# Patient Record
Sex: Female | Born: 1950 | Race: White | Hispanic: No | Marital: Married | Smoking: Never smoker
Health system: Southern US, Community
[De-identification: ages and names within clinical notes are randomized; demographics above are authoritative.]

## PROBLEM LIST (undated history)

## (undated) DIAGNOSIS — R011 Cardiac murmur, unspecified: Secondary | ICD-10-CM

## (undated) DIAGNOSIS — N189 Chronic kidney disease, unspecified: Secondary | ICD-10-CM

## (undated) DIAGNOSIS — R112 Nausea with vomiting, unspecified: Secondary | ICD-10-CM

## (undated) DIAGNOSIS — J302 Other seasonal allergic rhinitis: Secondary | ICD-10-CM

## (undated) DIAGNOSIS — H40009 Preglaucoma, unspecified, unspecified eye: Secondary | ICD-10-CM

## (undated) DIAGNOSIS — T7840XA Allergy, unspecified, initial encounter: Secondary | ICD-10-CM

## (undated) HISTORY — DX: Allergy, unspecified, initial encounter: T78.40XA

## (undated) HISTORY — DX: Cardiac murmur, unspecified: R01.1

## (undated) HISTORY — PX: OTHER SURGICAL HISTORY: SHX169

## (undated) HISTORY — DX: Other seasonal allergic rhinitis: J30.2

## (undated) HISTORY — DX: Preglaucoma, unspecified, unspecified eye: H40.009

---

## 2003-08-09 ENCOUNTER — Other Ambulatory Visit: Admission: RE | Admit: 2003-08-09 | Discharge: 2003-08-09 | Payer: Self-pay | Admitting: Family Medicine

## 2005-06-12 ENCOUNTER — Other Ambulatory Visit: Admission: RE | Admit: 2005-06-12 | Discharge: 2005-06-12 | Payer: Self-pay | Admitting: Family Medicine

## 2006-11-04 ENCOUNTER — Other Ambulatory Visit: Admission: RE | Admit: 2006-11-04 | Discharge: 2006-11-04 | Payer: Self-pay | Admitting: Family Medicine

## 2013-07-10 ENCOUNTER — Encounter: Payer: Self-pay | Admitting: *Deleted

## 2013-08-02 ENCOUNTER — Encounter: Payer: Self-pay | Admitting: Family Medicine

## 2013-08-04 ENCOUNTER — Ambulatory Visit (INDEPENDENT_AMBULATORY_CARE_PROVIDER_SITE_OTHER): Payer: BC Managed Care – PPO | Admitting: Family Medicine

## 2013-08-04 ENCOUNTER — Encounter: Payer: Self-pay | Admitting: Family Medicine

## 2013-08-04 VITALS — BP 111/67 | HR 82 | Temp 97.8°F | Ht 62.0 in | Wt 122.2 lb

## 2013-08-04 DIAGNOSIS — H40009 Preglaucoma, unspecified, unspecified eye: Secondary | ICD-10-CM

## 2013-08-04 DIAGNOSIS — Z23 Encounter for immunization: Secondary | ICD-10-CM

## 2013-08-04 DIAGNOSIS — Z0000 Encounter for general adult medical examination without abnormal findings: Secondary | ICD-10-CM | POA: Insufficient documentation

## 2013-08-04 DIAGNOSIS — R011 Cardiac murmur, unspecified: Secondary | ICD-10-CM | POA: Insufficient documentation

## 2013-08-04 DIAGNOSIS — J302 Other seasonal allergic rhinitis: Secondary | ICD-10-CM | POA: Insufficient documentation

## 2013-08-04 DIAGNOSIS — Z13220 Encounter for screening for lipoid disorders: Secondary | ICD-10-CM

## 2013-08-04 NOTE — Patient Instructions (Addendum)
HEALTH MAINTENANCE Immunizations: Tetanus-Diphtheria Booster [phone] Pertusis Booster [phone] Flu Shot Due: every Fall Pneumonia Vaccine: usually at 60 years of age unless there are certain risk situations. Herpes Zoster/Shingles Vaccine due: usually at 60 years of age HPV XBJ:YNWG age 52 to 23 years in males and females.  Healthy Life Habits: Exercise Goal: 5-6 days/week; start gradually(ie 30 minutes/3days per week) Nutrition: Balanced healthy meals including Vegetables and Fruits. Consider  Reading the following books: 1) Eat to Live by Dr Ottis Stain; 2) Prevent and Reverse Heart Disease by Dr Suzzette Righter.  Vitamins:a multivitamin is okay Aspirin:n/a Stop Tobacco Use:n/a Seat Belt Use:+++ recommended Sunscreen Use:+++ recommended Osteoporosis Prevention: 1) Exercise 2) Calcium/Vitamin D requirements:he [employer] [facility] [employer] recommends:    Calcium:  800 mg/day for children 82-14 years of age          60 mg/day for children 47-63 years of age          60 mg/day for adults 87-60 years of age          60 mg/day for everyone more than 60 years of age     Vitamin D: 800 IU per day or as prescribed if you are deficient.  Recommended Screening Tests: Colon Cancer Screening:2018 Blood work: today Cholesterol Screening:    today        HIV:    n/a                Hepatitis C(people born 1945-1965):n/a since blood  donor Mammogram:done DEXA/Bone Density:deferred Monthly Self Breast Exam:++++  Eye Exam: every 1 to 2 years recommended Dental Health: at least every 6 months  Others:    Living Will/Healthcare Power of Attorney: should have this in order with your personal estate planning

## 2013-08-04 NOTE — Progress Notes (Signed)
Patient ID: Joanne Sullivan, female   DOB: November 21, 1950, 62 y.o.   MRN: [id number] SUBJECTIVE: CC: Chief Complaint  Patient presents with  . Annual Exam    pap     HPI: Patient here for her annual physical. Thought she was due for a pap smear but it was normal 2 years ago and not due until next year.  Otherwise she has been well.no complaints. Her records in [facility] has been updated.  Regularly donates Platelets.  Past Medical History  Diagnosis Date  . Allergy   . Seasonal allergic reaction   . Heart murmur   . Glaucoma suspect    Past Surgical History  Procedure Laterality Date  . Left radial head fracture Left    History   Social History  . Marital Status: Unknown    Spouse Name: N/A    Number of Children: N/A  . Years of Education: N/A   Occupational History  . Not on file.   Social History Main Topics  . Smoking status: Never Smoker   . Smokeless tobacco: Not on file  . Alcohol Use: No  . Drug Use: No  . Sexual Activity: Not on file   Other Topics Concern  . Not on file   Social History Narrative  . No narrative on file   Family History  Problem Relation Age of Onset  . Heart disease Mother     2 MIs  . Hypertension Mother   . Heart disease Father 19    [facility]  . Cancer Sister     ovarian   No current outpatient prescriptions on file prior to visit.   No current facility-administered medications on file prior to visit.   No Known Allergies Immunization History  Administered Date(s) Administered  . Influenza,inj,Quad PF,36+ Mos 08/04/2013   Prior to Admission medications   Not on File    ROS: As above in the HPI. All other systems are stable or negative.  OBJECTIVE: APPEARANCE:  Patient in no acute distress.The patient appeared well nourished and normally developed. Acyanotic. Waist: VITAL SIGNS:BP 111/67  Pulse 82  Temp(Src) 97.8 F (36.6 C) (Oral)  Ht 5\' 2"  (1.575 m)  Wt 122 lb 3.2 oz (55.43 kg)  BMI 22.35  kg/m2 WF   SKIN: warm and  Dry without overt rashes, tattoos and scars  HEAD and Neck: without JVD, Head and scalp: normal Eyes:No scleral icterus. Fundi normal, eye movements normal. Ears: Auricle normal, canal normal, Tympanic membranes normal, insufflation normal. Nose: normal Throat: normal Neck & thyroid: normal  CHEST & LUNGS: Chest wall: normal Lungs: Clear  CVS: Reveals the PMI to be normally located. Regular rhythm, First and Second Heart sounds are normal,  absence of murmurs, rubs or gallops. Peripheral vasculature: Radial pulses: normal Dorsal pedis pulses: normal Posterior pulses: normal  ABDOMEN:  Appearance: normal Benign, no organomegaly, no masses, no Abdominal Aortic enlargement. No Guarding , no rebound. No Bruits. Bowel sounds: normal  RECTAL: Normal, heme negative brown stools.  GU:  Breast Exam:  Appearance: Skin : normal Areolas normalNipples normalDimples: Absent  Palpation:Normal No Masses No Lumps  Lymph Nodes:Axillary: Normal  PELVIC EXAM:  EXTERNAL Vulva:: Normal  SPECULUM: Vagina:atrophic otherwise Normal Cervix: Normal  BIMANUAL: CMT: Negative Adnexae: Normal      No  Masses Uterus: Anteverted  Normal Size No Masses Rectovaginal: Normal Hemoccult: Negative    EXTREMETIES: nonedematous.  MUSCULOSKELETAL:  Spine: normal Joints: intact  NEUROLOGIC: oriented to time,place and person; nonfocal. Strength is normal Sensory  is normal Reflexes are normal Cranial Nerves are normal.  ASSESSMENT: Annual physical exam - Plan: EKG 12-Lead, CMP14+EGFR  Seasonal allergic reaction  Heart murmur  Need for lipid screening - Plan: Lipid panel  Glaucoma suspect, unspecified laterality  Need for prophylactic vaccination and inoculation against influenza   PLAN: Orders Placed This Encounter  Procedures  . CMP14+EGFR  . Lipid panel  . EKG 12-Lead        HEALTH MAINTENANCE Immunizations: Tetanus-Diphtheria  Booster [id number] Pertusis Booster [phone] Flu Shot Due: every Fall Pneumonia Vaccine: usually at 60 years of age unless there are certain risk situations. Herpes Zoster/Shingles Vaccine due: usually at 60 years of age HPV XBJ:YNWG age 65 to 35 years in males and females.  Healthy Life Habits: Exercise Goal: 5-6 days/week; start gradually(ie 30 minutes/3days per week) Nutrition: Balanced healthy meals including Vegetables and Fruits. Consider  Reading the following books: 1) Eat to Live by Dr Ottis Stain; 2) Prevent and Reverse Heart Disease by Dr Suzzette Righter.  Vitamins:a multivitamin is okay Aspirin:n/a Stop Tobacco Use:n/a Seat Belt Use:+++ recommended Sunscreen Use:+++ recommended Osteoporosis Prevention: 1) Exercise 2) Calcium/Vitamin D requirements:he [employer] [facility] [employer] recommends:    Calcium:  800 mg/day for children 57-29 years of age          60 mg/day for children 84-57 years of age          60 mg/day for adults 66-51 years of age          60 mg/day for everyone more than 60 years of age     Vitamin D: 800 IU per day or as prescribed if you are deficient.  Recommended Screening Tests: Colon Cancer Screening:2018 Blood work: today Cholesterol Screening:    today        HIV:    n/a                Hepatitis C(people born 1945-1965):n/a since blood  donor Mammogram:done DEXA/Bone Density:deferred Monthly Self Breast Exam:++++  Eye Exam: every 1 to 2 years recommended Dental Health: at least every 6 months  Others:    Living Will/Healthcare Power of Attorney: should have this in order with your personal estate planning  Depression and Fall risk assessed.  Return in about 1 year (around 08/04/2014) for PEX.  Viona Hosking P. [place] Charon, M.D.

## 2013-08-05 LAB — CMP14+EGFR
ALT: 14 IU/L (ref 0–32)
AST: 21 IU/L (ref 0–40)
Albumin/Globulin Ratio: 1.9 (ref 1.1–2.5)
Albumin: 4.7 g/dL (ref 3.6–4.8)
Alkaline Phosphatase: 94 IU/L (ref 39–117)
BUN/Creatinine Ratio: 15 (ref 11–26)
BUN: 14 mg/dL (ref 8–27)
CO2: 25 mmol/L (ref 18–29)
Calcium: 9.6 mg/dL (ref 8.6–10.2)
Chloride: 100 mmol/L (ref 97–108)
Creatinine, Ser: 0.95 mg/dL (ref 0.57–1.00)
GFR calc Af Amer: 75 mL/min/{1.73_m2} (ref >59)
GFR calc non Af Amer: 65 mL/min/{1.73_m2} (ref >59)
Globulin, Total: 2.5 g/dL (ref 1.5–4.5)
Glucose: 88 mg/dL (ref 65–99)
Potassium: 4.2 mmol/L (ref 3.5–5.2)
Sodium: 143 mmol/L (ref 134–144)
Total Bilirubin: 0.6 mg/dL (ref 0.0–1.2)
Total Protein: 7.2 g/dL (ref 6.0–8.5)

## 2013-08-05 LAB — LIPID PANEL
Chol/HDL Ratio: 1.4 ratio units (ref 0.0–4.4)
Cholesterol, Total: 129 mg/dL (ref 100–199)
HDL: 92 mg/dL (ref >39)
LDL Calculated: 24 mg/dL (ref 0–99)
Triglycerides: 66 mg/dL (ref 0–149)
VLDL Cholesterol Cal: 13 mg/dL (ref 5–40)

## 2013-08-05 NOTE — Progress Notes (Signed)
Quick Note:  Call patient. Labs normal.Excellent lipids. No change in plan. ______

## 2014-09-04 ENCOUNTER — Encounter: Payer: BC Managed Care – PPO | Admitting: Family Medicine

## 2014-09-12 ENCOUNTER — Encounter: Payer: Self-pay | Admitting: Family Medicine

## 2014-09-12 ENCOUNTER — Ambulatory Visit (INDEPENDENT_AMBULATORY_CARE_PROVIDER_SITE_OTHER): Payer: BC Managed Care – PPO | Admitting: Family Medicine

## 2014-09-12 VITALS — BP 124/80 | HR 95 | Temp 97.8°F | Ht 62.5 in | Wt 124.4 lb

## 2014-09-12 DIAGNOSIS — Z0000 Encounter for general adult medical examination without abnormal findings: Secondary | ICD-10-CM

## 2014-09-12 DIAGNOSIS — Z780 Asymptomatic menopausal state: Secondary | ICD-10-CM

## 2014-09-12 DIAGNOSIS — Z01419 Encounter for gynecological examination (general) (routine) without abnormal findings: Secondary | ICD-10-CM

## 2014-09-12 NOTE — Progress Notes (Signed)
   Subjective:    Patient ID: Joanne Sullivan, female    DOB: 1951/06/07, 63 y.o.   MRN: [id number]  HPI 63 year old female here for physical exam generally she is very healthy. There is a history of heart murmur. She is asymptomatic.    Review of Systems  Constitutional: Negative.   HENT: Negative.   Eyes: Negative.   Respiratory: Negative.   Cardiovascular: Negative.   Gastrointestinal: Negative.   Endocrine: Negative.   Genitourinary: Negative.   Musculoskeletal: Positive for myalgias.       Bilateral thumb pain  Skin: Rash: not true rash but healing intertrigo.  Hematological: Negative.   Psychiatric/Behavioral: Negative.        Objective:   Physical Exam  Constitutional: She is oriented to person, place, and time. She appears well-developed and well-nourished.  Eyes: Conjunctivae and EOM are normal.  Neck: Normal range of motion. Neck supple.  Cardiovascular: Normal rate, regular rhythm and normal heart sounds.   Pulmonary/Chest: Effort normal and breath sounds normal.  Abdominal: Soft. Bowel sounds are normal.  Genitourinary: Vagina normal and uterus normal.  Pap taken  Musculoskeletal: Normal range of motion.  Neurological: She is alert and oriented to person, place, and time. She has normal reflexes.  Skin: Skin is warm and dry.  Psychiatric: She has a normal mood and affect. Her behavior is normal. Thought content normal.          Assessment & Plan:  BP 124/80 mmHg  Pulse 95  Temp(Src) 97.8 F (36.6 C) (Oral)  Ht 5' 2.5" (1.588 m)  Wt 124 lb 6.4 oz (56.427 kg)  BMI 22.38 kg/m2   1. Routine general medical examination at a health care facility Normal exam. Will do screening lab work as well as Pap. Would also recommend bone density in the additional calcium and vitamin D supplement in her diet - CMP14+EGFR  Wardell Honour MD - Lipid panel

## 2014-09-12 NOTE — Addendum Note (Signed)
Addended by: Shelbie Ammons on: 09/12/2014 10:05 AM   Modules accepted: Orders

## 2014-09-12 NOTE — Addendum Note (Signed)
Addended by: Earlene Plater on: 09/12/2014 01:29 PM   Modules accepted: Orders, SmartSet

## 2014-09-13 LAB — CMP14+EGFR
ALT: 13 IU/L (ref 0–32)
AST: 22 IU/L (ref 0–40)
Albumin/Globulin Ratio: 1.6 (ref 1.1–2.5)
Albumin: 4.5 g/dL (ref 3.6–4.8)
Alkaline Phosphatase: 99 IU/L (ref 39–117)
BUN/Creatinine Ratio: 13 (ref 11–26)
BUN: 14 mg/dL (ref 8–27)
CO2: 26 mmol/L (ref 18–29)
Calcium: 9.5 mg/dL (ref 8.7–10.3)
Chloride: 101 mmol/L (ref 97–108)
Creatinine, Ser: 1.06 mg/dL — ABNORMAL HIGH (ref 0.57–1.00)
GFR calc Af Amer: 65 mL/min/{1.73_m2} (ref >59)
GFR calc non Af Amer: 57 mL/min/{1.73_m2} — ABNORMAL LOW (ref >59)
Globulin, Total: 2.8 g/dL (ref 1.5–4.5)
Glucose: 84 mg/dL (ref 65–99)
Potassium: 4.3 mmol/L (ref 3.5–5.2)
Sodium: 143 mmol/L (ref 134–144)
Total Bilirubin: 0.8 mg/dL (ref 0.0–1.2)
Total Protein: 7.3 g/dL (ref 6.0–8.5)

## 2014-09-13 LAB — LIPID PANEL
Chol/HDL Ratio: 1.4 ratio units (ref 0.0–4.4)
Cholesterol, Total: 120 mg/dL (ref 100–199)
HDL: 85 mg/dL (ref >39)
LDL Calculated: 19 mg/dL (ref 0–99)
Triglycerides: 78 mg/dL (ref 0–149)
VLDL Cholesterol Cal: 16 mg/dL (ref 5–40)

## 2014-09-13 LAB — PAP IG W/ RFLX HPV ASCU: PAP Smear Comment: 0

## 2014-09-14 ENCOUNTER — Telehealth: Payer: Self-pay

## 2014-09-14 NOTE — Telephone Encounter (Signed)
-----   Message from Wardell Honour, MD sent at 09/14/2014  7:48 AM EST ----- Pap smear was negative. Would repeat in 3 years

## 2014-09-14 NOTE — Telephone Encounter (Signed)
Pt  Aware of pap smear results

## 2014-10-08 ENCOUNTER — Encounter: Payer: Self-pay | Admitting: Family Medicine

## 2014-10-09 NOTE — Telephone Encounter (Signed)
Please advise 

## 2014-11-28 ENCOUNTER — Ambulatory Visit: Payer: BC Managed Care – PPO

## 2014-11-28 ENCOUNTER — Other Ambulatory Visit: Payer: BC Managed Care – PPO

## 2015-05-10 ENCOUNTER — Encounter: Payer: Self-pay | Admitting: Family Medicine

## 2015-06-10 ENCOUNTER — Encounter: Payer: Self-pay | Admitting: *Deleted

## 2015-07-02 ENCOUNTER — Ambulatory Visit (INDEPENDENT_AMBULATORY_CARE_PROVIDER_SITE_OTHER): Payer: BLUE CROSS/BLUE SHIELD | Admitting: Family Medicine

## 2015-07-02 ENCOUNTER — Encounter: Payer: Self-pay | Admitting: Family Medicine

## 2015-07-02 VITALS — BP 127/76 | HR 80 | Temp 97.3°F | Ht 62.5 in | Wt 126.0 lb

## 2015-07-02 DIAGNOSIS — Z0000 Encounter for general adult medical examination without abnormal findings: Secondary | ICD-10-CM

## 2015-07-02 LAB — POCT URINALYSIS DIPSTICK
Bilirubin, UA: NEGATIVE
Blood, UA: NEGATIVE
Glucose, UA: NEGATIVE
Ketones, UA: NEGATIVE
Leukocytes, UA: NEGATIVE
Nitrite, UA: NEGATIVE
Protein, UA: NEGATIVE
Spec Grav, UA: 1.02
Urobilinogen, UA: NEGATIVE
pH, UA: 6

## 2015-07-02 NOTE — Progress Notes (Signed)
   Subjective:    Patient ID: Joanne Sullivan, female    DOB: 1951-09-24, 64 y.o.   MRN: [id number]  HPI  64 year old female who is here for annual physical. She has no complaints or problems. She is still very active doing spin classes and other exercises at her gym and playing softball in the spring and fall. She is on no medications. Last Pap was in 2014 and we decided to do that every 3 years.    Review of Systems  Constitutional: Negative.   HENT: Negative.   Eyes: Negative.   Respiratory: Negative.   Cardiovascular: Negative.   Gastrointestinal: Negative.   Endocrine: Negative.   Genitourinary: Negative.   Hematological: Negative.   Psychiatric/Behavioral: Negative.    Patient Active Problem List   Diagnosis Date Noted  . Annual physical exam 08/04/2013  . Seasonal allergic reaction   . Heart murmur   . Glaucoma suspect    No outpatient encounter prescriptions on file as of 07/02/2015.   No facility-administered encounter medications on file as of 07/02/2015.       Objective:   Physical Exam  Constitutional: She is oriented to person, place, and time. She appears well-developed and well-nourished.  Eyes: Conjunctivae and EOM are normal.  Neck: Normal range of motion. Neck supple.  Cardiovascular: Normal rate, regular rhythm and normal heart sounds.   Pulmonary/Chest: Effort normal and breath sounds normal.  Abdominal: Soft. Bowel sounds are normal.  Musculoskeletal: Normal range of motion.  Neurological: She is alert and oriented to person, place, and time. She has normal reflexes.  Skin: Skin is warm and dry.  Psychiatric: She has a normal mood and affect. Her behavior is normal. Thought content normal.          Assessment & Plan:  1. Health care maintenance Exam is normal will do annual labs. No specific recommendations. - POCT urinalysis dipstick - CMP14+EGFR - Lipid panel - CBC with Differential/Platelet  Wardell Honour MD

## 2015-07-03 LAB — LIPID PANEL
Chol/HDL Ratio: 1.9 ratio units (ref 0.0–4.4)
Cholesterol, Total: 136 mg/dL (ref 100–199)
HDL: 70 mg/dL (ref >39)
LDL Calculated: 22 mg/dL (ref 0–99)
Triglycerides: 220 mg/dL — ABNORMAL HIGH (ref 0–149)
VLDL Cholesterol Cal: 44 mg/dL — ABNORMAL HIGH (ref 5–40)

## 2015-07-03 LAB — CMP14+EGFR
ALT: 12 IU/L (ref 0–32)
AST: 20 IU/L (ref 0–40)
Albumin/Globulin Ratio: 1.5 (ref 1.1–2.5)
Albumin: 4.4 g/dL (ref 3.6–4.8)
Alkaline Phosphatase: 103 IU/L (ref 39–117)
BUN/Creatinine Ratio: 16 (ref 11–26)
BUN: 14 mg/dL (ref 8–27)
Bilirubin Total: 0.6 mg/dL (ref 0.0–1.2)
CO2: 27 mmol/L (ref 18–29)
Calcium: 9.5 mg/dL (ref 8.7–10.3)
Chloride: 101 mmol/L (ref 97–108)
Creatinine, Ser: 0.87 mg/dL (ref 0.57–1.00)
GFR calc Af Amer: 83 mL/min/{1.73_m2} (ref >59)
GFR calc non Af Amer: 72 mL/min/{1.73_m2} (ref >59)
Globulin, Total: 3 g/dL (ref 1.5–4.5)
Glucose: 99 mg/dL (ref 65–99)
Potassium: 4.3 mmol/L (ref 3.5–5.2)
Sodium: 141 mmol/L (ref 134–144)
Total Protein: 7.4 g/dL (ref 6.0–8.5)

## 2015-07-03 LAB — CBC WITH DIFFERENTIAL/PLATELET
Basophils Absolute: 0.1 10*3/uL (ref 0.0–0.2)
Basos: 1 %
EOS (ABSOLUTE): 0.1 10*3/uL (ref 0.0–0.4)
Eos: 3 %
Hematocrit: 43.2 % (ref 34.0–46.6)
Hemoglobin: 14.5 g/dL (ref 11.1–15.9)
Immature Grans (Abs): 0 10*3/uL (ref 0.0–0.1)
Immature Granulocytes: 0 %
Lymphocytes Absolute: 1.5 10*3/uL (ref 0.7–3.1)
Lymphs: 38 %
MCH: 29.4 pg (ref 26.6–33.0)
MCHC: 33.6 g/dL (ref 31.5–35.7)
MCV: 87 fL (ref 79–97)
Monocytes Absolute: 0.5 10*3/uL (ref 0.1–0.9)
Monocytes: 12 %
Neutrophils Absolute: 1.8 10*3/uL (ref 1.4–7.0)
Neutrophils: 46 %
Platelets: 202 10*3/uL (ref 150–379)
RBC: 4.94 x10E6/uL (ref 3.77–5.28)
RDW: 13.6 % (ref 12.3–15.4)
WBC: 3.9 10*3/uL (ref 3.4–10.8)

## 2015-07-05 ENCOUNTER — Encounter: Payer: Self-pay | Admitting: Family Medicine

## 2015-10-02 ENCOUNTER — Telehealth: Payer: Self-pay | Admitting: Family Medicine

## 2016-02-19 DIAGNOSIS — H40003 Preglaucoma, unspecified, bilateral: Secondary | ICD-10-CM | POA: Diagnosis not present

## 2016-02-19 DIAGNOSIS — H5213 Myopia, bilateral: Secondary | ICD-10-CM | POA: Diagnosis not present

## 2016-02-19 DIAGNOSIS — H04123 Dry eye syndrome of bilateral lacrimal glands: Secondary | ICD-10-CM | POA: Diagnosis not present

## 2016-03-18 DIAGNOSIS — H5213 Myopia, bilateral: Secondary | ICD-10-CM | POA: Diagnosis not present

## 2016-05-13 ENCOUNTER — Encounter: Payer: Self-pay | Admitting: Pediatrics

## 2016-05-13 ENCOUNTER — Ambulatory Visit (INDEPENDENT_AMBULATORY_CARE_PROVIDER_SITE_OTHER): Payer: BLUE CROSS/BLUE SHIELD | Admitting: Pediatrics

## 2016-05-13 VITALS — BP 112/74 | HR 75 | Temp 99.1°F | Ht 62.5 in | Wt 127.8 lb

## 2016-05-13 DIAGNOSIS — R198 Other specified symptoms and signs involving the digestive system and abdomen: Secondary | ICD-10-CM

## 2016-05-13 DIAGNOSIS — F458 Other somatoform disorders: Secondary | ICD-10-CM

## 2016-05-13 DIAGNOSIS — R0989 Other specified symptoms and signs involving the circulatory and respiratory systems: Secondary | ICD-10-CM

## 2016-05-13 DIAGNOSIS — J069 Acute upper respiratory infection, unspecified: Secondary | ICD-10-CM | POA: Diagnosis not present

## 2016-05-13 NOTE — Progress Notes (Signed)
    Subjective:    Patient ID: Joanne Sullivan, female    DOB: 1951/09/04, 65 y.o.   MRN: [id number]  CC: Sore Throat and Hoarse   HPI: Joanne Sullivan is a 65 y.o. female presenting for Sore Throat and Hoarse  Had a cold last week Some hoarseness that started a few days ago, now voice starting to come back Yesterday started feeling like she had a lump in her throat, halfway down neck Today throat a little bit sore hasnt had anything to eat today, not sure if food gets caught on the lump or not Still with some congestion  Appetite fine, no weight changes No fevers   Depression screen [facility] 2/9 05/13/2016 07/02/2015 09/12/2014 08/04/2013  Decreased Interest 0 0 0 0  Down, Depressed, Hopeless 0 0 0 0  PHQ - 2 Score 0 0 0 0     Relevant past medical, surgical, family and social history reviewed and updated as indicated.  Interim medical history since our last visit reviewed. Allergies and medications reviewed and updated.  ROS: Per HPI unless specifically indicated above  History  Smoking status  . Never Smoker   Smokeless tobacco  . Never Used       Objective:    BP 112/74 mmHg  Pulse 75  Temp(Src) 99.1 F (37.3 C) (Oral)  Ht 5' 2.5" (1.588 m)  Wt 127 lb 12.8 oz (57.97 kg)  BMI 22.99 kg/m2  Wt Readings from Last 3 Encounters:  05/13/16 127 lb 12.8 oz (57.97 kg)  07/02/15 126 lb (57.153 kg)  09/12/14 124 lb 6.4 oz (56.427 kg)     Gen: NAD, alert, cooperative with exam, NCAT, slightly congested EYES: EOMI, no scleral injection or icterus ENT:  TMs pearly gray b/l, OP with mild erythema LYMPH: no cervical LAD NECK: normal thyroid, no lumps CV: NRRR, normal S1/S2, no murmur Resp: CTABL, no wheezes, normal WOB Ext: No edema, warm Neuro: Alert and oriented MSK: normal muscle bulk     Assessment & Plan:    Joanne Sullivan was seen today for sore throat and hoarseness, started getting congestion and a cold last week. Mostly getting better  from cold other than these symptoms. Discussed using flonase for ongoing congestion, let me know if globus feeling doesn't improve next few days with cold symptoms, will refer to ENT.  Diagnoses and all orders for this visit:  Globus sensation  Acute URI    Follow up plan: As needed  Assunta Found, MD [facility] 05/13/2016, 11:43 AM

## 2016-10-29 DIAGNOSIS — Z1231 Encounter for screening mammogram for malignant neoplasm of breast: Secondary | ICD-10-CM | POA: Diagnosis not present

## 2016-10-30 ENCOUNTER — Ambulatory Visit (INDEPENDENT_AMBULATORY_CARE_PROVIDER_SITE_OTHER): Payer: BLUE CROSS/BLUE SHIELD | Admitting: Family Medicine

## 2016-10-30 ENCOUNTER — Encounter: Payer: Self-pay | Admitting: Family Medicine

## 2016-10-30 VITALS — BP 98/60 | HR 72 | Temp 97.6°F | Ht 62.5 in | Wt 126.0 lb

## 2016-10-30 DIAGNOSIS — Z01411 Encounter for gynecological examination (general) (routine) with abnormal findings: Secondary | ICD-10-CM

## 2016-10-30 DIAGNOSIS — Z23 Encounter for immunization: Secondary | ICD-10-CM

## 2016-10-30 DIAGNOSIS — Z0000 Encounter for general adult medical examination without abnormal findings: Secondary | ICD-10-CM

## 2016-10-30 LAB — CMP14+EGFR
ALT: 13 IU/L (ref 0–32)
AST: 23 IU/L (ref 0–40)
Albumin/Globulin Ratio: 1.6 (ref 1.2–2.2)
Albumin: 4.2 g/dL (ref 3.6–4.8)
Alkaline Phosphatase: 93 IU/L (ref 39–117)
BUN/Creatinine Ratio: 16 (ref 12–28)
BUN: 15 mg/dL (ref 8–27)
Bilirubin Total: 0.5 mg/dL (ref 0.0–1.2)
CO2: 26 mmol/L (ref 18–29)
Calcium: 9.1 mg/dL (ref 8.7–10.3)
Chloride: 102 mmol/L (ref 96–106)
Creatinine, Ser: 0.91 mg/dL (ref 0.57–1.00)
GFR calc Af Amer: 78 mL/min/{1.73_m2} (ref >59)
GFR calc non Af Amer: 67 mL/min/{1.73_m2} (ref >59)
Globulin, Total: 2.6 g/dL (ref 1.5–4.5)
Glucose: 81 mg/dL (ref 65–99)
Potassium: 3.8 mmol/L (ref 3.5–5.2)
Sodium: 143 mmol/L (ref 134–144)
Total Protein: 6.8 g/dL (ref 6.0–8.5)

## 2016-10-30 LAB — LIPID PANEL
Chol/HDL Ratio: 1.5 ratio units (ref 0.0–4.4)
Cholesterol, Total: 122 mg/dL (ref 100–199)
HDL: 84 mg/dL (ref >39)
LDL Calculated: 30 mg/dL (ref 0–99)
Triglycerides: 41 mg/dL (ref 0–149)
VLDL Cholesterol Cal: 8 mg/dL (ref 5–40)

## 2016-10-30 NOTE — Addendum Note (Signed)
Addended by: Zannie Cove on: 10/30/2016 11:05 AM   Modules accepted: Orders

## 2016-10-30 NOTE — Progress Notes (Signed)
   Subjective:    Patient ID: Joanne Sullivan, female    DOB: Jan 03, 1951, 66 y.o.   MRN: [id number]  HPI  Patient is here today for annual wellness exam and follow up of chronic medical problems. She is not currently taking any medications.  66 year old female who is amazingly healthy. She is very conscientious with her diet and exercises at the gym several times a week. She has no complaints or symptoms today. Last Pap was 3-4 years ago and she had a mammogram within the last week or 2.    Patient Active Problem List   Diagnosis Date Noted  . Annual physical exam 08/04/2013  . Seasonal allergic reaction   . Heart murmur   . Glaucoma suspect    No outpatient encounter prescriptions on file as of 10/30/2016.   No facility-administered encounter medications on file as of 10/30/2016.      Review of Systems  Constitutional: Negative.   HENT: Negative.   Eyes: Negative.   Respiratory: Negative.   Cardiovascular: Negative.   Gastrointestinal: Negative.   Endocrine: Negative.   Genitourinary: Negative.   Musculoskeletal: Negative.   Skin: Negative.   Allergic/Immunologic: Negative.   Neurological: Negative.   Hematological: Negative.   Psychiatric/Behavioral: Negative.        Objective:   Physical Exam  Constitutional: She is oriented to person, place, and time. She appears well-developed and well-nourished.  Eyes: Conjunctivae and EOM are normal.  Neck: Normal range of motion. Neck supple.  Cardiovascular: Normal rate, regular rhythm and normal heart sounds.   Pulmonary/Chest: Effort normal and breath sounds normal.  Abdominal: Soft. Bowel sounds are normal.  Genitourinary: Vagina normal and uterus normal. No vaginal discharge found.  Musculoskeletal: Normal range of motion.  Neurological: She is alert and oriented to person, place, and time. She has normal reflexes.  Skin: Skin is warm and dry.  Psychiatric: She has a normal mood and affect. Her behavior is  normal. Thought content normal.   BP 98/60 (BP Location: Left Arm)   Pulse 72   Temp 97.6 F (36.4 C) (Oral)   Ht 5' 2.5" (1.588 m)   Wt 126 lb (57.2 kg)   BMI 22.68 kg/m         Assessment & Plan:  1. Annual physical exam Exam is normal. Do not expect any significant change with lipids or other labs - CMP14+EGFR - Lipid panel  2. Encounter for gynecological examination with abnormal finding Normal pelvic exam. No masses appreciated. - CMP14+EGFR - Lipid panel  Wardell Honour MD

## 2016-10-30 NOTE — Patient Instructions (Signed)
Continue current medications. Continue good therapeutic lifestyle changes which include good diet and exercise. Fall precautions discussed with patient. If an FOBT was given today- please return it to our front desk. If you are over 64 years old - you may need Prevnar 13 or the adult Pneumonia vaccine.  **Flu shots are available--- please call and schedule a FLU-CLINIC appointment**  After your visit with us today you will receive a survey in the mail or online from [employer] regarding your care with us. Please take a moment to fill this out. Your feedback is very important to us as you can help us better understand your patient needs as well as improve your experience and satisfaction. WE CARE ABOUT YOU!!!    

## 2016-11-03 LAB — PAP IG W/ RFLX HPV ASCU: PAP Smear Comment: 0

## 2016-11-20 DIAGNOSIS — H5213 Myopia, bilateral: Secondary | ICD-10-CM | POA: Diagnosis not present

## 2016-12-01 DIAGNOSIS — H40003 Preglaucoma, unspecified, bilateral: Secondary | ICD-10-CM | POA: Diagnosis not present

## 2016-12-08 ENCOUNTER — Encounter: Payer: Self-pay | Admitting: Family Medicine

## 2017-01-26 ENCOUNTER — Encounter: Payer: Self-pay | Admitting: Family Medicine

## 2017-01-26 ENCOUNTER — Ambulatory Visit (INDEPENDENT_AMBULATORY_CARE_PROVIDER_SITE_OTHER): Payer: BLUE CROSS/BLUE SHIELD | Admitting: Family Medicine

## 2017-01-26 VITALS — BP 116/75 | HR 73 | Temp 97.3°F | Ht 62.5 in | Wt 128.4 lb

## 2017-01-26 DIAGNOSIS — R590 Localized enlarged lymph nodes: Secondary | ICD-10-CM | POA: Diagnosis not present

## 2017-01-26 NOTE — Progress Notes (Signed)
   HPI  Patient presents today with tenderness in the underarm.  Patient states that about a day ago she had some tenderness in the right underarm, she palpated around and felt what felt like a lymph node.  She states that it is bothersome but overall not painful and not so serious that she needs or would like medication for it.  She is up-to-date on her mammogram and has had a recent breast exam.  She is not concerned about breast cancer but would like to make sure it's followed up appropriately.    PMH: Smoking status noted ROS: Per HPI  Objective: BP 116/75   Pulse 73   Temp 97.3 F (36.3 C) (Oral)   Ht 5' 2.5" (1.588 m)   Wt 128 lb 6.4 oz (58.2 kg)   BMI 23.11 kg/m  Gen: NAD, alert, cooperative with exam HEENT: NCAT CV: RRR, good S1/S2, no murmur Resp: CTABL, no wheezes, non-labored Ext: No edema, warm Neuro: Alert and oriented, No gross deficits  Skin Right axilla with nontender rubbery, approximately 1 cm mobile lymph node in the right axilla   Assessment and plan:  # Axillary lymphadenopathy Patient does not have any lumps or bumps in the breast that she's concerned about, she's had a recent mammogram and breast exam She would like to watch and wait, recommended 4 weeks of monitoring and return for exam. If still present would recommend breast exam, possible mammogram, however without any concerning areas I would not recommend mammogram. Would recommend referral to surgery for biopsy if persistent   Laroy Apple, MD [facility] 01/26/2017, 4:49 PM

## 2017-01-26 NOTE — Patient Instructions (Signed)
Great to meet you!  Lets follow up in 4 weeks, if it has not resolved we will take the next steps.

## 2017-03-25 DIAGNOSIS — Z1211 Encounter for screening for malignant neoplasm of colon: Secondary | ICD-10-CM | POA: Diagnosis not present

## 2017-03-25 DIAGNOSIS — D122 Benign neoplasm of ascending colon: Secondary | ICD-10-CM | POA: Diagnosis not present

## 2017-03-30 ENCOUNTER — Ambulatory Visit (INDEPENDENT_AMBULATORY_CARE_PROVIDER_SITE_OTHER): Payer: BLUE CROSS/BLUE SHIELD | Admitting: Family Medicine

## 2017-03-30 ENCOUNTER — Encounter: Payer: Self-pay | Admitting: Family Medicine

## 2017-03-30 VITALS — BP 121/71 | HR 71 | Temp 97.6°F | Ht 62.5 in | Wt 129.0 lb

## 2017-03-30 DIAGNOSIS — R599 Enlarged lymph nodes, unspecified: Secondary | ICD-10-CM | POA: Diagnosis not present

## 2017-03-30 NOTE — Progress Notes (Signed)
   HPI  Patient presents today here for enlarged lymph node.  Patient was seen on April 4 for the same problem. At that time she had developed an intermittently tender right axillary lymph node. Since that time it is no longer tender, however is persistent. She has annual mammograms and her last was normal in January. Patient denies any concerning lumps or bumps in the breast.  She denies any history of cancer of any type, breast cancer, or lymph node abnormalities.  No fever, chills, sweats.    PMH: Smoking status noted ROS: Per HPI  Objective: BP 121/71   Pulse 71   Temp 97.6 F (36.4 C) (Oral)   Ht 5' 2.5" (1.588 m)   Wt 129 lb (58.5 kg)   BMI 23.22 kg/m  Gen: NAD, alert, cooperative with exam HEENT: NCAT CV: RRR, good S1/S2, no murmur Resp: CTABL, no wheezes, non-labored Ext: No edema, warm Neuro: Alert and oriented, No gross deficits Skin: R Axillary LN approx 2 cm, mobile, soft, and non tender  Assessment and plan:  # Lymph node enlargement, persistent Considering the proximity of the lymph node to the breast I think it's most prudent to go ahead and refer to Gen. surgery to consider biopsy. Persistent now for 2 months Patient does not have any other concerning findings for breast cancer Lymph node is moderately small, nontender, and mobile.     Orders Placed This Encounter  Procedures  . Ambulatory referral to General Surgery    Referral Priority:   Routine    Referral Type:   Surgical    Referral Reason:   Specialty Services Required    Requested Specialty:   General Surgery    Number of Visits Requested:   [place], MD [facility] 03/30/2017, 8:45 AM

## 2017-03-30 NOTE — Patient Instructions (Signed)
Great to see you!  Come back with any concerns, we will work on scheduling you an appointment witht the surgeon to discuss a possible biopsy

## 2017-04-21 DIAGNOSIS — H04123 Dry eye syndrome of bilateral lacrimal glands: Secondary | ICD-10-CM | POA: Diagnosis not present

## 2017-04-21 DIAGNOSIS — H40003 Preglaucoma, unspecified, bilateral: Secondary | ICD-10-CM | POA: Diagnosis not present

## 2017-04-21 DIAGNOSIS — H5213 Myopia, bilateral: Secondary | ICD-10-CM | POA: Diagnosis not present

## 2017-04-23 ENCOUNTER — Encounter: Payer: Self-pay | Admitting: Family Medicine

## 2017-04-23 ENCOUNTER — Ambulatory Visit (INDEPENDENT_AMBULATORY_CARE_PROVIDER_SITE_OTHER): Payer: BLUE CROSS/BLUE SHIELD | Admitting: Family Medicine

## 2017-04-23 VITALS — BP 114/76 | HR 69 | Temp 98.0°F | Ht 62.5 in | Wt 128.0 lb

## 2017-04-23 DIAGNOSIS — W57XXXA Bitten or stung by nonvenomous insect and other nonvenomous arthropods, initial encounter: Secondary | ICD-10-CM | POA: Diagnosis not present

## 2017-04-23 DIAGNOSIS — M436 Torticollis: Secondary | ICD-10-CM

## 2017-04-23 MED ORDER — DOXYCYCLINE HYCLATE 100 MG PO CAPS: 100.0000 mg | ORAL_CAPSULE | Freq: Two times a day (BID) | ORAL | 0 refills | Status: DC

## 2017-04-23 MED ORDER — CYCLOBENZAPRINE HCL 10 MG PO TABS: 10.0000 mg | ORAL_TABLET | Freq: Three times a day (TID) | ORAL | 1 refills | Status: DC | PRN

## 2017-04-23 NOTE — Progress Notes (Signed)
Chief Complaint  Patient presents with  . Neck Pain    pt here today c/o neck pain x 4 days    HPI  Patient presents today for Her neck is felt stiff. It's hard to turn it. No known injury. Onset 4 days ago. A few days for before that her husband had found a tick on her arm pulled it off. She brought the tick in today showing its size of a typical deer tick nymph. She notes she had 2 bites back in April as well.Denies fever chills sweats, rash, arthralgia(other than the neck pain noted today).  PMH: Smoking status noted ROS: Per HPI  Objective: BP 114/76   Pulse 69   Temp 98 F (36.7 C) (Oral)   Ht 5' 2.5" (1.588 m)   Wt 128 lb (58.1 kg)   BMI 23.04 kg/m  Gen: NAD, alert, cooperative with exam HEENT: NCAT, EOMI, PERRL CV: RRR, good S1/S2, no murmur Resp: CTABL, no wheezes, non-labored Abd: SNTND, BS present, no guarding or organomegaly Ext: No edema, warm Neuro: Alert and oriented, No gross deficits  Assessment and plan:  1. Tick bite, initial encounter   2. Torticollis, acute     Meds ordered this encounter  Medications  . doxycycline (VIBRAMYCIN) 100 MG capsule    Sig: Take 1 capsule (100 mg total) by mouth 2 (two) times daily.    Dispense:  20 capsule    Refill:  0  . cyclobenzaprine (FLEXERIL) 10 MG tablet    Sig: Take 1 tablet (10 mg total) by mouth 3 (three) times daily as needed for muscle spasms.    Dispense:  90 tablet    Refill:  1    Orders Placed This Encounter  Procedures  . Lyme Ab/Western Blot Reflex  . Sedimentation rate  . CBC with Differential/Platelet    Follow up as needed.  Claretta Fraise, MD

## 2017-04-27 LAB — CBC WITH DIFFERENTIAL/PLATELET
Basophils Absolute: 0 10*3/uL (ref 0.0–0.2)
Basos: 1 %
EOS (ABSOLUTE): 0.1 10*3/uL (ref 0.0–0.4)
Eos: 1 %
Hematocrit: 37.8 % (ref 34.0–46.6)
Hemoglobin: 12 g/dL (ref 11.1–15.9)
Immature Grans (Abs): 0 10*3/uL (ref 0.0–0.1)
Immature Granulocytes: 0 %
Lymphocytes Absolute: 1.4 10*3/uL (ref 0.7–3.1)
Lymphs: 23 %
MCH: 27.5 pg (ref 26.6–33.0)
MCHC: 31.7 g/dL (ref 31.5–35.7)
MCV: 87 fL (ref 79–97)
Monocytes Absolute: 0.8 10*3/uL (ref 0.1–0.9)
Monocytes: 14 %
Neutrophils Absolute: 3.6 10*3/uL (ref 1.4–7.0)
Neutrophils: 61 %
Platelets: 222 10*3/uL (ref 150–379)
RBC: 4.37 x10E6/uL (ref 3.77–5.28)
RDW: 15.7 % — ABNORMAL HIGH (ref 12.3–15.4)
WBC: 5.9 10*3/uL (ref 3.4–10.8)

## 2017-04-27 LAB — SEDIMENTATION RATE: Sed Rate: 14 mm/hr (ref 0–40)

## 2017-04-27 LAB — LYME AB/WESTERN BLOT REFLEX
LYME DISEASE AB, QUANT, IGM: <0.8 index (ref 0.00–0.79)
Lyme IgG/IgM Ab: <0.91 {ISR} (ref 0.00–0.90)

## 2017-05-03 ENCOUNTER — Other Ambulatory Visit: Payer: Self-pay | Admitting: Surgery

## 2017-05-03 DIAGNOSIS — R590 Localized enlarged lymph nodes: Secondary | ICD-10-CM

## 2017-05-21 ENCOUNTER — Ambulatory Visit
Admission: RE | Admit: 2017-05-21 | Discharge: 2017-05-21 | Disposition: A | Discharge status: Home / Self Care | Payer: BLUE CROSS/BLUE SHIELD | Source: Ambulatory Visit | Attending: Surgery | Admitting: Surgery

## 2017-05-21 ENCOUNTER — Ambulatory Visit: Admission: RE | Admit: 2017-05-21 | Payer: BLUE CROSS/BLUE SHIELD | Source: Ambulatory Visit

## 2017-05-21 ENCOUNTER — Other Ambulatory Visit: Payer: Self-pay | Admitting: Surgery

## 2017-05-21 DIAGNOSIS — R921 Mammographic calcification found on diagnostic imaging of breast: Secondary | ICD-10-CM | POA: Diagnosis not present

## 2017-05-21 DIAGNOSIS — N6310 Unspecified lump in the right breast, unspecified quadrant: Secondary | ICD-10-CM

## 2017-05-21 DIAGNOSIS — R590 Localized enlarged lymph nodes: Secondary | ICD-10-CM

## 2017-05-21 DIAGNOSIS — R922 Inconclusive mammogram: Secondary | ICD-10-CM | POA: Diagnosis not present

## 2017-05-21 DIAGNOSIS — N6489 Other specified disorders of breast: Secondary | ICD-10-CM | POA: Diagnosis not present

## 2017-05-25 NOTE — Progress Notes (Signed)
Please call the patient and let them know that their mammogram and ultrasound was normal.  There is no sign of enlarged lymph nodes, so there is no need for any further testing or work-up. Follow-up PRN.

## 2017-07-12 DIAGNOSIS — H5213 Myopia, bilateral: Secondary | ICD-10-CM | POA: Diagnosis not present

## 2017-07-20 DIAGNOSIS — Z23 Encounter for immunization: Secondary | ICD-10-CM | POA: Diagnosis not present

## 2017-12-09 ENCOUNTER — Encounter: Payer: BLUE CROSS/BLUE SHIELD | Admitting: Pediatrics

## 2017-12-13 ENCOUNTER — Encounter: Payer: BLUE CROSS/BLUE SHIELD | Admitting: Pediatrics

## 2017-12-29 ENCOUNTER — Ambulatory Visit (INDEPENDENT_AMBULATORY_CARE_PROVIDER_SITE_OTHER): Payer: BLUE CROSS/BLUE SHIELD | Admitting: Pediatrics

## 2017-12-29 ENCOUNTER — Encounter: Payer: Self-pay | Admitting: Pediatrics

## 2017-12-29 ENCOUNTER — Ambulatory Visit (INDEPENDENT_AMBULATORY_CARE_PROVIDER_SITE_OTHER): Payer: BLUE CROSS/BLUE SHIELD

## 2017-12-29 VITALS — BP 124/73 | HR 62 | Temp 97.5°F | Ht 62.5 in | Wt 125.2 lb

## 2017-12-29 DIAGNOSIS — Z780 Asymptomatic menopausal state: Secondary | ICD-10-CM | POA: Diagnosis not present

## 2017-12-29 DIAGNOSIS — Z0000 Encounter for general adult medical examination without abnormal findings: Secondary | ICD-10-CM

## 2017-12-29 DIAGNOSIS — M810 Age-related osteoporosis without current pathological fracture: Secondary | ICD-10-CM | POA: Diagnosis not present

## 2017-12-29 NOTE — Progress Notes (Signed)
  Subjective:   Patient ID: Lillie Portner, female    DOB: [birth year], 67 y.o.   MRN: [id number] CC: Annual Exam  HPI: Miyuki Rzasa is a 67 y.o. female presenting for Annual Exam  Colonoscopy: done 2018, f/u 10 yrs  Mammo: due, has gotten reminder letter from [facility]  Pap smear: 10/2016  Recent lipid panel at work: Tot Chol 122, HDL 84, LDL 14, TRG 106   Stays active, goes to the gym twice a week for an hour, does cardio and weight lifting with classes   Works in [employer]  Relevant past medical, surgical, family and social history reviewed. Allergies and medications reviewed and updated. Social History   Tobacco Use  Smoking Status Never Smoker  Smokeless Tobacco Never Used   ROS: all systems negative other than what is in HPI  Objective:    BP 124/73   Pulse 62   Temp (!) 97.5 F (36.4 C) (Oral)   Ht 5' 2.5" (1.588 m)   Wt 125 lb 3.2 oz (56.8 kg)   BMI 22.53 kg/m   Wt Readings from Last 3 Encounters:  12/29/17 125 lb 3.2 oz (56.8 kg)  04/23/17 128 lb (58.1 kg)  03/30/17 129 lb (58.5 kg)    Gen: NAD, alert, cooperative with exam, NCAT EYES: EOMI, no conjunctival injection, or no icterus ENT:  TMs pearly gray b/l, OP without erythema LYMPH: no cervical LAD CV: NRRR, normal S1/S2, no murmur, distal pulses 2+ b/l Resp: CTABL, no wheezes, normal WOB Abd: +BS, soft, NTND. no guarding or organomegaly Ext: No edema, warm Neuro: Alert and oriented, strength equal b/l UE and LE, coordination grossly normal MSK: normal muscle bulk  Assessment & Plan:  Neida was seen today for annual exam.  Diagnoses and all orders for this visit:  Encounter for preventive health examination  Post-menopausal -     DG [facility] DEXA   Follow up plan: 1 yr, sooner if needed Assunta Found, MD [place]

## 2018-01-24 ENCOUNTER — Other Ambulatory Visit: Payer: Self-pay | Admitting: Pediatrics

## 2018-01-24 DIAGNOSIS — Z1159 Encounter for screening for other viral diseases: Secondary | ICD-10-CM

## 2018-01-24 DIAGNOSIS — M810 Age-related osteoporosis without current pathological fracture: Secondary | ICD-10-CM

## 2018-01-24 MED ORDER — ALENDRONATE SODIUM 70 MG PO TABS: 70.0000 mg | ORAL_TABLET | ORAL | 3 refills | Status: DC

## 2018-02-04 ENCOUNTER — Other Ambulatory Visit: Payer: BLUE CROSS/BLUE SHIELD

## 2018-02-04 DIAGNOSIS — Z1159 Encounter for screening for other viral diseases: Secondary | ICD-10-CM

## 2018-02-04 DIAGNOSIS — M810 Age-related osteoporosis without current pathological fracture: Secondary | ICD-10-CM | POA: Diagnosis not present

## 2018-02-05 LAB — BMP8+EGFR
BUN/Creatinine Ratio: 16 (ref 12–28)
BUN: 15 mg/dL (ref 8–27)
CO2: 28 mmol/L (ref 20–29)
Calcium: 9.3 mg/dL (ref 8.7–10.3)
Chloride: 101 mmol/L (ref 96–106)
Creatinine, Ser: 0.92 mg/dL (ref 0.57–1.00)
GFR calc Af Amer: 76 mL/min/{1.73_m2} (ref >59)
GFR calc non Af Amer: 66 mL/min/{1.73_m2} (ref >59)
Glucose: 86 mg/dL (ref 65–99)
Potassium: 4 mmol/L (ref 3.5–5.2)
Sodium: 141 mmol/L (ref 134–144)

## 2018-02-05 LAB — HEPATITIS C ANTIBODY: Hep C Virus Ab: <0.1 s/co ratio (ref 0.0–0.9)

## 2018-07-12 DIAGNOSIS — Z1231 Encounter for screening mammogram for malignant neoplasm of breast: Secondary | ICD-10-CM | POA: Diagnosis not present

## 2018-07-12 LAB — HM MAMMOGRAPHY

## 2018-07-21 DIAGNOSIS — Z23 Encounter for immunization: Secondary | ICD-10-CM | POA: Diagnosis not present

## 2018-12-26 ENCOUNTER — Other Ambulatory Visit: Payer: Self-pay | Admitting: *Deleted

## 2019-01-10 ENCOUNTER — Other Ambulatory Visit: Payer: Self-pay | Admitting: Family Medicine

## 2019-01-10 ENCOUNTER — Telehealth: Payer: Self-pay | Admitting: Family Medicine

## 2019-01-10 DIAGNOSIS — M810 Age-related osteoporosis without current pathological fracture: Secondary | ICD-10-CM

## 2019-01-10 DIAGNOSIS — Z114 Encounter for screening for human immunodeficiency virus [HIV]: Secondary | ICD-10-CM

## 2019-01-10 DIAGNOSIS — Z130 Encounter for screening for diseases of the blood and blood-forming organs and certain disorders involving the immune mechanism: Secondary | ICD-10-CM

## 2019-01-10 DIAGNOSIS — Z13220 Encounter for screening for lipoid disorders: Secondary | ICD-10-CM

## 2019-01-10 NOTE — Telephone Encounter (Signed)
Left message,  Orders have been done for lab work.  Please be fasting.

## 2019-01-10 NOTE — Telephone Encounter (Signed)
Labs placed. Please come in fasting.

## 2019-01-17 ENCOUNTER — Encounter: Payer: BLUE CROSS/BLUE SHIELD | Admitting: Family Medicine

## 2019-02-14 ENCOUNTER — Encounter: Payer: BLUE CROSS/BLUE SHIELD | Admitting: Family Medicine

## 2019-04-10 ENCOUNTER — Telehealth: Payer: Self-pay | Admitting: Family Medicine

## 2019-04-10 ENCOUNTER — Other Ambulatory Visit: Payer: Self-pay

## 2019-04-11 ENCOUNTER — Other Ambulatory Visit: Payer: Self-pay

## 2019-04-11 ENCOUNTER — Encounter: Payer: Self-pay | Admitting: Family Medicine

## 2019-04-11 ENCOUNTER — Ambulatory Visit (INDEPENDENT_AMBULATORY_CARE_PROVIDER_SITE_OTHER): Payer: BLUE CROSS/BLUE SHIELD | Admitting: Family Medicine

## 2019-04-11 VITALS — BP 119/75 | HR 67 | Temp 98.4°F | Ht 62.0 in | Wt 125.0 lb

## 2019-04-11 DIAGNOSIS — Z13220 Encounter for screening for lipoid disorders: Secondary | ICD-10-CM

## 2019-04-11 DIAGNOSIS — Z23 Encounter for immunization: Secondary | ICD-10-CM | POA: Diagnosis not present

## 2019-04-11 DIAGNOSIS — Z0000 Encounter for general adult medical examination without abnormal findings: Secondary | ICD-10-CM

## 2019-04-11 DIAGNOSIS — M810 Age-related osteoporosis without current pathological fracture: Secondary | ICD-10-CM | POA: Diagnosis not present

## 2019-04-11 DIAGNOSIS — L819 Disorder of pigmentation, unspecified: Secondary | ICD-10-CM

## 2019-04-11 DIAGNOSIS — L821 Other seborrheic keratosis: Secondary | ICD-10-CM

## 2019-04-11 DIAGNOSIS — Z0001 Encounter for general adult medical examination with abnormal findings: Secondary | ICD-10-CM | POA: Diagnosis not present

## 2019-04-11 NOTE — Patient Instructions (Signed)
I will work on getting the Prolia covered.  I think you would tolerate this medication better than the Fosamax.    Health Maintenance, Female Adopting a healthy lifestyle and getting preventive care can go a long way to promote health and wellness. Talk with your health care provider about what schedule of regular examinations is right for you. This is a good chance for you to check in with your provider about disease prevention and staying healthy. In between checkups, there are plenty of things you can do on your own. Experts have done a lot of research about which lifestyle changes and preventive measures are most likely to keep you healthy. Ask your health care provider for more information. Weight and diet Eat a healthy diet  Be sure to include plenty of vegetables, fruits, low-fat dairy products, and lean protein.  Do not eat a lot of foods high in solid fats, added sugars, or salt.  Get regular exercise. This is one of the most important things you can do for your health. ? Most adults should exercise for at least 150 minutes each week. The exercise should increase your heart rate and make you sweat (moderate-intensity exercise). ? Most adults should also do strengthening exercises at least twice a week. This is in addition to the moderate-intensity exercise. Maintain a healthy weight  Body mass index (BMI) is a measurement that can be used to identify possible weight problems. It estimates body fat based on height and weight. Your health care provider can help determine your BMI and help you achieve or maintain a healthy weight.  For females 71 years of age and older: ? A BMI below 18.5 is considered underweight. ? A BMI of 18.5 to 24.9 is normal. ? A BMI of 25 to 29.9 is considered overweight. ? A BMI of 30 and above is considered obese. Watch levels of cholesterol and blood lipids  You should start having your blood tested for lipids and cholesterol at 66 years of age, then have  this test every 5 years.  You may need to have your cholesterol levels checked more often if: ? Your lipid or cholesterol levels are high. ? You are older than 66 years of age. ? You are at high risk for heart disease. Cancer screening Lung Cancer  Lung cancer screening is recommended for adults 99-69 years old who are at high risk for lung cancer because of a history of smoking.  A yearly low-dose CT scan of the lungs is recommended for people who: ? Currently smoke. ? Have quit within the past 15 years. ? Have at least a 30-pack-year history of smoking. A pack year is smoking an average of one pack of cigarettes a day for 1 year.  Yearly screening should continue until it has been 15 years since you quit.  Yearly screening should stop if you develop a health problem that would prevent you from having lung cancer treatment. Breast Cancer  Practice breast self-awareness. This means understanding how your breasts normally appear and feel.  It also means doing regular breast self-exams. Let your health care provider know about any changes, no matter how small.  If you are in your 20s or 30s, you should have a clinical breast exam (CBE) by a health care provider every 1-3 years as part of a regular health exam.  If you are 7 or older, have a CBE every year. Also consider having a breast X-ray (mammogram) every year.  If you have a family history  of breast cancer, talk to your health care provider about genetic screening.  If you are at high risk for breast cancer, talk to your health care provider about having an MRI and a mammogram every year.  Breast cancer gene (BRCA) assessment is recommended for women who have family members with BRCA-related cancers. BRCA-related cancers include: ? Breast. ? Ovarian. ? Tubal. ? Peritoneal cancers.  Results of the assessment will determine the need for genetic counseling and BRCA1 and BRCA2 testing. Cervical Cancer Your health care  provider may recommend that you be screened regularly for cancer of the pelvic organs (ovaries, uterus, and vagina). This screening involves a pelvic examination, including checking for microscopic changes to the surface of your cervix (Pap test). You may be encouraged to have this screening done every 3 years, beginning at age 8.  For women ages 72-65, health care providers may recommend pelvic exams and Pap testing every 3 years, or they may recommend the Pap and pelvic exam, combined with testing for human papilloma virus (HPV), every 5 years. Some types of HPV increase your risk of cervical cancer. Testing for HPV may also be done on women of any age with unclear Pap test results.  Other health care providers may not recommend any screening for nonpregnant women who are considered low risk for pelvic cancer and who do not have symptoms. Ask your health care provider if a screening pelvic exam is right for you.  If you have had past treatment for cervical cancer or a condition that could lead to cancer, you need Pap tests and screening for cancer for at least 20 years after your treatment. If Pap tests have been discontinued, your risk factors (such as having a new sexual partner) need to be reassessed to determine if screening should resume. Some women have medical problems that increase the chance of getting cervical cancer. In these cases, your health care provider may recommend more frequent screening and Pap tests. Colorectal Cancer  This type of cancer can be detected and often prevented.  Routine colorectal cancer screening usually begins at 66 years of age and continues through 66 years of age.  Your health care provider may recommend screening at an earlier age if you have risk factors for colon cancer.  Your health care provider may also recommend using home test kits to check for hidden blood in the stool.  A small camera at the end of a tube can be used to examine your colon directly  (sigmoidoscopy or colonoscopy). This is done to check for the earliest forms of colorectal cancer.  Routine screening usually begins at age 72.  Direct examination of the colon should be repeated every 5-10 years through 67 years of age. However, you may need to be screened more often if early forms of precancerous polyps or small growths are found. Skin Cancer  Check your skin from head to toe regularly.  Tell your health care provider about any new moles or changes in moles, especially if there is a change in a mole's shape or color.  Also tell your health care provider if you have a mole that is larger than the size of a pencil eraser.  Always use sunscreen. Apply sunscreen liberally and repeatedly throughout the day.  Protect yourself by wearing long sleeves, pants, a wide-brimmed hat, and sunglasses whenever you are outside. Heart disease, diabetes, and high blood pressure  High blood pressure causes heart disease and increases the risk of stroke. High blood pressure is more  likely to develop in: ? People who have blood pressure in the high end of the normal range (130-139/85-89 mm Hg). ? People who are overweight or obese. ? People who are African American.  If you are 70-23 years of age, have your blood pressure checked every 3-5 years. If you are 5 years of age or older, have your blood pressure checked every year. You should have your blood pressure measured twice-once when you are at a hospital or clinic, and once when you are not at a hospital or clinic. Record the average of the two measurements. To check your blood pressure when you are not at a hospital or clinic, you can use: ? An automated blood pressure machine at a pharmacy. ? A home blood pressure monitor.  If you are between 14 years and 28 years old, ask your health care provider if you should take aspirin to prevent strokes.  Have regular diabetes screenings. This involves taking a blood sample to check your  fasting blood sugar level. ? If you are at a normal weight and have a low risk for diabetes, have this test once every three years after 66 years of age. ? If you are overweight and have a high risk for diabetes, consider being tested at a younger age or more often. Preventing infection Hepatitis B  If you have a higher risk for hepatitis B, you should be screened for this virus. You are considered at high risk for hepatitis B if: ? You were born in a country where hepatitis B is common. Ask your health care provider which countries are considered high risk. ? Your parents were born in a high-risk country, and you have not been immunized against hepatitis B (hepatitis B vaccine). ? You have HIV or AIDS. ? You use needles to inject street drugs. ? You live with someone who has hepatitis B. ? You have had sex with someone who has hepatitis B. ? You get hemodialysis treatment. ? You take certain medicines for conditions, including cancer, organ transplantation, and autoimmune conditions. Hepatitis C  Blood testing is recommended for: ? Everyone born from [birth year] through 1963. ? Anyone with known risk factors for hepatitis C. Sexually transmitted infections (STIs)  You should be screened for sexually transmitted infections (STIs) including gonorrhea and chlamydia if: ? You are sexually active and are younger than 66 years of age. ? You are older than 66 years of age and your health care provider tells you that you are at risk for this type of infection. ? Your sexual activity has changed since you were last screened and you are at an increased risk for chlamydia or gonorrhea. Ask your health care provider if you are at risk.  If you do not have HIV, but are at risk, it may be recommended that you take a prescription medicine daily to prevent HIV infection. This is called pre-exposure prophylaxis (PrEP). You are considered at risk if: ? You are sexually active and do not regularly use condoms or  know the HIV status of your partner(s). ? You take drugs by injection. ? You are sexually active with a partner who has HIV. Talk with your health care provider about whether you are at high risk of being infected with HIV. If you choose to begin PrEP, you should first be tested for HIV. You should then be tested every 3 months for as long as you are taking PrEP. Pregnancy  If you are premenopausal and you may become pregnant, ask  your health care provider about preconception counseling.  If you may become pregnant, take 400 to 800 micrograms (mcg) of folic acid every day.  If you want to prevent pregnancy, talk to your health care provider about birth control (contraception). Osteoporosis and menopause  Osteoporosis is a disease in which the bones lose minerals and strength with aging. This can result in serious bone fractures. Your risk for osteoporosis can be identified using a bone density scan.  If you are 63 years of age or older, or if you are at risk for osteoporosis and fractures, ask your health care provider if you should be screened.  Ask your health care provider whether you should take a calcium or vitamin D supplement to lower your risk for osteoporosis.  Menopause may have certain physical symptoms and risks.  Hormone replacement therapy may reduce some of these symptoms and risks. Talk to your health care provider about whether hormone replacement therapy is right for you. Follow these instructions at home:  Schedule regular health, dental, and eye exams.  Stay current with your immunizations.  Do not use any tobacco products including cigarettes, chewing tobacco, or electronic cigarettes.  If you are pregnant, do not drink alcohol.  If you are breastfeeding, limit how much and how often you drink alcohol.  Limit alcohol intake to no more than 1 drink per day for nonpregnant women. One drink equals 12 ounces of beer, 5 ounces of wine, or 1 ounces of hard  liquor.  Do not use street drugs.  Do not share needles.  Ask your health care provider for help if you need support or information about quitting drugs.  Tell your health care provider if you often feel depressed.  Tell your health care provider if you have ever been abused or do not feel safe at home. This information is not intended to replace advice given to you by your health care provider. Make sure you discuss any questions you have with your health care provider. Document Released: 04/27/2011 Document Revised: 03/19/2016 Document Reviewed: 07/16/2015 Elsevier Interactive Patient Education  2019 [employer].

## 2019-04-11 NOTE — Progress Notes (Signed)
Joanne Sullivan is a 68 y.o. female presents to office today for annual physical exam examination.    Concerns today include: 1.  Osteoporosis Patient reports she had to discontinue the Fosamax because she was having severe acid reflux from the medication and had some joint pain as well.  She would be amenable to transitioning over to something like Prolia.  She does engage in regular physical activity, walking 3 miles 4 to 5 days/week.  She does some upper extremity weightlifting as well.  She takes calcium and vitamin D supplements OTC.  She tries to maintain a fairly healthy diet but is not really like drinking milk.  Occupation: Joanne Sullivan, Marital status: Married, Substance use: None Diet: Eats a variety of foods but low in dairy, Exercise: Regular exercise Last eye exam: Has appointment next week with Joanne Sullivan Last colonoscopy: 03/25/2017, repeat 10y Last mammogram: 06/2018 Last pap smear: N/A, greater than 53 years old.  Previous Paps were normal. Refills needed today: None. Immunizations needed: PNA vaccine  Past Medical History:  Diagnosis Date  . Allergy   . Glaucoma suspect   . Heart murmur   . Seasonal allergic reaction    Social History   Socioeconomic History  . Marital status: Married    Spouse name: Not on file  . Number of children: Not on file  . Years of education: Not on file  . Highest education level: Not on file  Occupational History  . Not on file  Social Needs  . Financial resource strain: Not on file  . Food insecurity    Worry: Not on file    Inability: Not on file  . Transportation needs    Medical: Not on file    Non-medical: Not on file  Tobacco Use  . Smoking status: Never Smoker  . Smokeless tobacco: Never Used  Substance and Sexual Activity  . Alcohol use: No  . Drug use: No  . Sexual activity: Not on file  Lifestyle  . Physical activity    Days per week: Not on file    Minutes per session: Not on file  .  Stress: Not on file  Relationships  . Social Herbalist on phone: Not on file    Gets together: Not on file    Attends religious service: Not on file    Active member of club or organization: Not on file    Attends meetings of clubs or organizations: Not on file    Relationship status: Not on file  . Intimate partner violence    Fear of current or ex partner: Not on file    Emotionally abused: Not on file    Physically abused: Not on file    Forced sexual activity: Not on file  Other Topics Concern  . Not on file  Social History Narrative  . Not on file   Past Surgical History:  Procedure Laterality Date  . left radial head fracture Left    Family History  Problem Relation Age of Onset  . Heart disease Mother        2 MIs  . Hypertension Mother   . Heart disease Father 52       AMI  . Cancer Sister        ovarian    Current Outpatient Medications:  .  calcium carbonate (OSCAL) 1500 (600 Ca) MG TABS tablet, Take 1,200 mg by mouth daily with breakfast., Disp: , Rfl:   No Known  Allergies   ROS: Review of Systems Constitutional: negative Eyes: positive for glaucoma Ears, nose, mouth, throat, and face: negative Respiratory: negative Cardiovascular: negative Gastrointestinal: negative Genitourinary:negative Integument/breast: negative Hematologic/lymphatic: negative Musculoskeletal:negative Neurological: negative Behavioral/Psych: negative Endocrine: negative Allergic/Immunologic: negative    Physical exam BP 119/75   Pulse 67   Temp 98.4 F (36.9 C) (Oral)   Ht '5\' 2"'$  (1.575 m)   Wt 125 lb (56.7 kg)   BMI 22.86 kg/m  General appearance: alert, cooperative, appears stated age and no distress Head: Normocephalic, without obvious abnormality, atraumatic Eyes: negative findings: lids and lashes normal, conjunctivae and sclerae normal, corneas clear and pupils equal, round, reactive to light and accomodation Ears: normal TM's and external ear canals  both ears Nose: Nares normal. Septum midline. Mucosa normal. No drainage or sinus tenderness. Throat: lips, mucosa, and tongue normal; teeth and gums normal Neck: no adenopathy, no carotid bruit, supple, symmetrical, trachea midline and thyroid not enlarged, symmetric, no tenderness/mass/nodules Back: symmetric, no curvature. ROM normal. No CVA tenderness. Lungs: clear to auscultation bilaterally Heart: regular rate and rhythm, S1, S2 normal, no murmur, click, rub or gallop Abdomen: soft, non-tender; bowel sounds normal; no masses,  no organomegaly Extremities: extremities normal, atraumatic, no cyanosis or edema Pulses: 2+ and symmetric Skin: Several pigmented lesions along bilateral upper extremities and back.  She has several hemangiomas along bilateral flanks.  There are a few seborrheic keratosis with the largest right at her bra line on the left side of her mid back. Lymph nodes: Cervical, supraclavicular, and axillary nodes normal. Neurologic: Alert and oriented X 3, normal strength and tone. Normal symmetric reflexes. Normal coordination and gait Psych: Mood stable, speech normal, affect appropriate, pleasant and interactive Depression screen [facility] 2/9 04/11/2019 12/29/2017 04/23/2017  Decreased Interest 0 0 0  Down, Depressed, Hopeless 0 0 0  PHQ - 2 Score 0 0 0  Altered sleeping 0 - -  Tired, decreased energy 0 - -  Change in appetite 0 - -  Feeling bad or failure about yourself  0 - -  Trouble concentrating 0 - -  Moving slowly or fidgety/restless 0 - -  Suicidal thoughts 0 - -  PHQ-9 Score 0 - -    Assessment/ Plan: Joanne Sullivan Joanne Sullivan here for annual physical exam.   1. Annual physical exam PNA vaccine administered.  UTD on all other preventative care  2. Age-related osteoporosis without current pathological fracture We will check calcium and vitamin D levels.  We discussed Prolia today as an option for treatment of osteoporosis.  She would like to look into this  and I have gone ahead and asked that we see if we get approval by her insurance. - CMP14+EGFR - CBC - TSH - VITAMIN D 25 Hydroxy (Vit-D Deficiency, Fractures)  3. Screening, lipid Nonfasting - Lipid Panel  4. Seborrheic keratoses Appear benign.  I placed a referral to dermatology for other pigmented skin lesions along her back and upper extremities.  She has one halo type nevus along the left anterior chest that I would like looked at specifically - Ambulatory referral to Dermatology  5. Pigmented skin lesion of uncertain nature As above - Ambulatory referral to Dermatology   Counseled on healthy lifestyle choices, including diet (rich in fruits, vegetables and lean meats and low in salt and simple carbohydrates) and exercise (at least 30 minutes of moderate physical activity daily).  Patient to follow up in 1 year for annual exam or sooner if needed.   M. Lajuana Ripple,  DO

## 2019-04-11 NOTE — Addendum Note (Signed)
Addended byCarrolyn Leigh on: 04/11/2019 03:09 PM   Modules accepted: Orders

## 2019-04-12 LAB — CBC
Hematocrit: 36.4 % (ref 34.0–46.6)
Hemoglobin: 11.5 g/dL (ref 11.1–15.9)
MCH: 25.3 pg — ABNORMAL LOW (ref 26.6–33.0)
MCHC: 31.6 g/dL (ref 31.5–35.7)
MCV: 80 fL (ref 79–97)
Platelets: 217 10*3/uL (ref 150–450)
RBC: 4.54 x10E6/uL (ref 3.77–5.28)
RDW: 14.7 % (ref 11.7–15.4)
WBC: 6.3 10*3/uL (ref 3.4–10.8)

## 2019-04-12 LAB — CMP14+EGFR
ALT: 18 IU/L (ref 0–32)
AST: 25 IU/L (ref 0–40)
Albumin/Globulin Ratio: 1.3 (ref 1.2–2.2)
Albumin: 4 g/dL (ref 3.8–4.8)
Alkaline Phosphatase: 93 IU/L (ref 39–117)
BUN/Creatinine Ratio: 16 (ref 12–28)
BUN: 14 mg/dL (ref 8–27)
Bilirubin Total: 0.4 mg/dL (ref 0.0–1.2)
CO2: 24 mmol/L (ref 20–29)
Calcium: 9 mg/dL (ref 8.7–10.3)
Chloride: 103 mmol/L (ref 96–106)
Creatinine, Ser: 0.88 mg/dL (ref 0.57–1.00)
GFR calc Af Amer: 79 mL/min/{1.73_m2} (ref >59)
GFR calc non Af Amer: 69 mL/min/{1.73_m2} (ref >59)
Globulin, Total: 3 g/dL (ref 1.5–4.5)
Glucose: 88 mg/dL (ref 65–99)
Potassium: 4.2 mmol/L (ref 3.5–5.2)
Sodium: 140 mmol/L (ref 134–144)
Total Protein: 7 g/dL (ref 6.0–8.5)

## 2019-04-12 LAB — LIPID PANEL
Chol/HDL Ratio: 1.5 ratio (ref 0.0–4.4)
Cholesterol, Total: 103 mg/dL (ref 100–199)
HDL: 71 mg/dL (ref >39)
LDL Calculated: 17 mg/dL (ref 0–99)
Triglycerides: 77 mg/dL (ref 0–149)
VLDL Cholesterol Cal: 15 mg/dL (ref 5–40)

## 2019-04-12 LAB — TSH: TSH: 1.51 u[IU]/mL (ref 0.450–4.500)

## 2019-04-12 LAB — VITAMIN D 25 HYDROXY (VIT D DEFICIENCY, FRACTURES): Vit D, 25-Hydroxy: 70.1 ng/mL (ref 30.0–100.0)

## 2019-04-18 DIAGNOSIS — H5213 Myopia, bilateral: Secondary | ICD-10-CM | POA: Diagnosis not present

## 2019-04-18 DIAGNOSIS — H04123 Dry eye syndrome of bilateral lacrimal glands: Secondary | ICD-10-CM | POA: Diagnosis not present

## 2019-04-18 DIAGNOSIS — H40003 Preglaucoma, unspecified, bilateral: Secondary | ICD-10-CM | POA: Diagnosis not present

## 2019-07-20 DIAGNOSIS — Z1231 Encounter for screening mammogram for malignant neoplasm of breast: Secondary | ICD-10-CM | POA: Diagnosis not present

## 2019-08-03 ENCOUNTER — Telehealth: Payer: Self-pay | Admitting: Family Medicine

## 2019-08-04 NOTE — Telephone Encounter (Signed)
Called and notified patient that we are currently not doing exams in the office and that I could set her up at [facility] to have done.  Patient would like to wait on our office to start exams back and I informed her that I would contact her in the next couple of weeks to let her know status of when we would be starting them again. MPulliam, CMA/RT(R)

## 2019-09-18 ENCOUNTER — Telehealth: Payer: Self-pay | Admitting: Family Medicine

## 2019-09-18 NOTE — Telephone Encounter (Signed)
Please put her in a virtual visit so we can discuss

## 2019-09-18 NOTE — Telephone Encounter (Signed)
It looks like she was on OsCal OTC.  Ok to resume.

## 2019-09-18 NOTE — Telephone Encounter (Signed)
Patient aware and verbalized understanding. Patient wants  Something like fasomax called in she was on that in the past , but had side effects. Please advise. Is there something else we can call in/

## 2019-09-19 ENCOUNTER — Ambulatory Visit (INDEPENDENT_AMBULATORY_CARE_PROVIDER_SITE_OTHER): Payer: BLUE CROSS/BLUE SHIELD | Admitting: Family Medicine

## 2019-09-19 ENCOUNTER — Encounter: Payer: Self-pay | Admitting: Family Medicine

## 2019-09-19 ENCOUNTER — Other Ambulatory Visit: Payer: Self-pay

## 2019-09-19 DIAGNOSIS — M810 Age-related osteoporosis without current pathological fracture: Secondary | ICD-10-CM | POA: Diagnosis not present

## 2019-09-19 MED ORDER — IBANDRONATE SODIUM 150 MG PO TABS: 150.0000 mg | ORAL_TABLET | ORAL | 12 refills | Status: DC

## 2019-09-19 NOTE — Telephone Encounter (Signed)
Aware. 

## 2019-09-19 NOTE — Progress Notes (Signed)
Telephone visit  Subjective: CC: osteoporosis PCP: Janora Norlander, DO [id number] Joanne Sullivan is a 68 y.o. female calls for telephone consult today. Patient provides verbal consent for consult held via phone.  Location of patient: home Location of provider: Working remotely from home Others present for call: none  1.  Osteoporosis Last DEXA scan was in March 2019 which showed a T score of -2.8.  She was started on Fosamax for this.  She discontinued the Fosamax because this was causing severe acid reflux as well as some joint pain.  In June 2020, we discussed alternatives including Prolia.  Her calcium level in June was normal, as was her vitamin Joanne level.  She notes that she has been worrying more about her bones lately.  She has had people that she has noted that have died after osteoporotic fracture and she does not want this to happen to her.  She is currently taking supplemental vitamin Joanne and calcium.  She walks several miles per day with her friend.  She tries to speed walk to give herself a little extra cardio.  She also does upper extremity workouts.   ROS: Per HPI  No Known Allergies Past Medical History:  Diagnosis Date  . Allergy   . Glaucoma suspect   . Heart murmur   . Seasonal allergic reaction     Current Outpatient Medications:  .  calcium carbonate (OSCAL) 1500 (600 Ca) MG TABS tablet, Take 1,200 mg by mouth daily with breakfast., Disp: , Rfl:   Assessment/ Plan: 68 y.o. female   1. Age-related osteoporosis without current pathological fracture We discussed the options including alternative oral bisphosphonate versus Reclast versus Prolia.  Patient would like to proceed with oral bisphosphonate.  Boniva prescribed.  We discussed how to take the medication.  We will plan for repeat DEXA at some point next year.  She will contact me sooner than her 67-month follow-up if she is having any problems with the medication.  I have encouraged her to continue  calcium and vitamin Joanne supplementation as well as weightbearing exercise.  She voiced good understanding of the plan and will follow-up as needed - ibandronate (BONIVA) 150 MG tablet; Take 1 tablet (150 mg total) by mouth every 30 (thirty) days. Take in the morning with a full glass of water, on an empty stomach, and do not take anything else by mouth or lie down for the next 30 min. (May need to run as brand for ins to cover)  Dispense: 1 tablet; Refill: 12   Start time: 9:26am End time: 9:40am  Total time spent on patient care (including telephone call/ virtual visit): 18 minutes  [place], [place] [phone]

## 2019-09-19 NOTE — Patient Instructions (Signed)
Ibandronate monthly tablets What is this medicine? IBANDRONATE (i BAN droh nate) slows calcium loss from bones. It is used to treat osteoporosis in women past the age of menopause. This medicine may be used for other purposes; ask your health care provider or pharmacist if you have questions. COMMON BRAND NAME(S): Boniva What should I tell my health care provider before I take this medicine? They need to know if you have any of these conditions:  dental disease  esophageal, stomach, or intestine problems, like acid reflux or GERD  kidney disease  low blood calcium  low vitamin D  problems sitting or standing for 60 minutes  trouble swallowing  an unusual or allergic reaction to ibandronate, other medicines, foods, dyes, or preservatives  pregnant or trying to get pregnant  breast-feeding How should I use this medicine? You must take this medicine exactly as directed or you will lower the amount of medicine you absorb into your body or you may cause yourself harm. Take your dose by mouth first thing in the morning, after you are up for the day. Do not eat or drink anything before you take this medicine. Swallow the tablet with a full glass (6 to 8 ounces) of plain water. Do not take this medicine with any other drink. Do not chew or crush the tablet. After taking this medicine, do not eat breakfast, drink, or take any other medicines or vitamins for at least 1 hour. Stand or sit up for at least 1 hour after taking this medicine. Do not lie down. Take this medicine on the same day every month. Do not take your medicine more often than directed. Talk to your pediatrician regarding the use of this medicine in children. Special care may be needed. Overdosage: If you think you have taken too much of this medicine contact a poison control center or emergency room at once. NOTE: This medicine is only for you. Do not share this medicine with others. What if I miss a dose? If you miss a dose  and your next dose is more than 7 days away then take the missed dose on the next morning you remember. Then take your next dose on your regular day of the month. If your next dose is due within the next 7 days then skip the missed dose. Do not take 2 tablets within 1 week of each other. Do not take double or extra doses. What may interact with this medicine?  aluminum hydroxide  antacids  aspirin  calcium supplements  drugs for inflammation like ibuprofen, naproxen, and others  iron supplements  magnesium supplements  vitamins with minerals This list may not describe all possible interactions. Give your health care provider a list of all the medicines, herbs, non-prescription drugs, or dietary supplements you use. Also tell them if you smoke, drink alcohol, or use illegal drugs. Some items may interact with your medicine. What should I watch for while using this medicine? Visit your doctor or health care professional for regular check ups. It may be some time before you see the benefit from this medicine. Do not stop taking your medicine unless your doctor tells you to. Your doctor may order blood tests and other tests to see how you are doing. You should make sure that you get enough calcium and vitamin D while you are taking this medicine. Discuss the foods you eat and the vitamins you take with your health care professional. Some people who take this medicine have severe bone, joint, and/or  muscle pain. This medicine may also increase your risk for a broken thigh bone. Tell your doctor right away if you have pain in your upper leg or groin. Tell your doctor if you have any pain that does not go away or that gets worse. What side effects may I notice from receiving this medicine? Side effects that you should report to your doctor or health care professional as soon as possible:  allergic reactions such as skin rash or itching, hives, swelling of the face, lips, throat or tongue  black  or tarry stools  change in vision  chest pain  heartburn or stomach pain  jaw pain, especially after dental work  redness, blistering, peeling, or loosening of the skin, including inside the mouth  trouble or pain when swallowing Side effects that usually do not require medical attention (report to your doctor or health care professional if they continue or are bothersome):  bone, muscle or joint pain  changes in taste  diarrhea or constipation  headache  nausea or vomiting  stomach gas or fullness This list may not describe all possible side effects. Call your doctor for medical advice about side effects. You may report side effects to FDA at 1-800-FDA-1088. Where should I keep my medicine? Keep out of the reach of children. Store at room temperature between 15 and 30 degrees C (59 and 86 degrees F). Throw away any unused medication after the expiration date. NOTE: This sheet is a summary. It may not cover all possible information. If you have questions about this medicine, talk to your doctor, pharmacist, or health care provider.  2020 Elsevier/Gold Standard (2011-04-10 09:03:09)

## 2020-03-29 ENCOUNTER — Other Ambulatory Visit: Payer: Self-pay | Admitting: *Deleted

## 2020-03-29 ENCOUNTER — Telehealth: Payer: Self-pay | Admitting: Family Medicine

## 2020-03-29 DIAGNOSIS — Z130 Encounter for screening for diseases of the blood and blood-forming organs and certain disorders involving the immune mechanism: Secondary | ICD-10-CM

## 2020-03-29 DIAGNOSIS — Z13220 Encounter for screening for lipoid disorders: Secondary | ICD-10-CM

## 2020-03-29 DIAGNOSIS — M810 Age-related osteoporosis without current pathological fracture: Secondary | ICD-10-CM

## 2020-03-29 NOTE — Telephone Encounter (Signed)
Left message , future lab work ordered. Note will be sent to personnel to hopefully schedule Dexa scan on day of appointment,  (time 3:15).

## 2020-04-02 NOTE — Telephone Encounter (Signed)
Pt would like a call to schedule her Dexa scan.

## 2020-04-03 NOTE — Telephone Encounter (Signed)
Spoke patient will have done at next appt if machine is working at that time

## 2020-05-07 ENCOUNTER — Other Ambulatory Visit: Payer: Medicare PPO

## 2020-05-07 ENCOUNTER — Other Ambulatory Visit: Payer: Self-pay

## 2020-05-07 DIAGNOSIS — Z130 Encounter for screening for diseases of the blood and blood-forming organs and certain disorders involving the immune mechanism: Secondary | ICD-10-CM

## 2020-05-07 DIAGNOSIS — M810 Age-related osteoporosis without current pathological fracture: Secondary | ICD-10-CM

## 2020-05-07 DIAGNOSIS — Z13220 Encounter for screening for lipoid disorders: Secondary | ICD-10-CM

## 2020-05-08 LAB — CBC WITH DIFFERENTIAL/PLATELET
Basophils Absolute: 0.1 10*3/uL (ref 0.0–0.2)
Basos: 2 %
EOS (ABSOLUTE): 0.2 10*3/uL (ref 0.0–0.4)
Eos: 4 %
Hematocrit: 43.8 % (ref 34.0–46.6)
Hemoglobin: 14.6 g/dL (ref 11.1–15.9)
Immature Grans (Abs): 0 10*3/uL (ref 0.0–0.1)
Immature Granulocytes: 0 %
Lymphocytes Absolute: 1.7 10*3/uL (ref 0.7–3.1)
Lymphs: 40 %
MCH: 30 pg (ref 26.6–33.0)
MCHC: 33.3 g/dL (ref 31.5–35.7)
MCV: 90 fL (ref 79–97)
Monocytes Absolute: 0.5 10*3/uL (ref 0.1–0.9)
Monocytes: 12 %
Neutrophils Absolute: 1.8 10*3/uL (ref 1.4–7.0)
Neutrophils: 42 %
Platelets: 196 10*3/uL (ref 150–450)
RBC: 4.86 x10E6/uL (ref 3.77–5.28)
RDW: 12.7 % (ref 11.7–15.4)
WBC: 4.2 10*3/uL (ref 3.4–10.8)

## 2020-05-08 LAB — LIPID PANEL
Chol/HDL Ratio: 1.5 ratio (ref 0.0–4.4)
Cholesterol, Total: 124 mg/dL (ref 100–199)
HDL: 81 mg/dL (ref >39)
LDL Chol Calc (NIH): 25 mg/dL (ref 0–99)
Triglycerides: 95 mg/dL (ref 0–149)
VLDL Cholesterol Cal: 18 mg/dL (ref 5–40)

## 2020-05-08 LAB — CMP14+EGFR
ALT: 27 IU/L (ref 0–32)
AST: 26 IU/L (ref 0–40)
Albumin/Globulin Ratio: 1.6 (ref 1.2–2.2)
Albumin: 4.4 g/dL (ref 3.8–4.8)
Alkaline Phosphatase: 97 IU/L (ref 48–121)
BUN/Creatinine Ratio: 18 (ref 12–28)
BUN: 19 mg/dL (ref 8–27)
Bilirubin Total: 0.6 mg/dL (ref 0.0–1.2)
CO2: 25 mmol/L (ref 20–29)
Calcium: 9.2 mg/dL (ref 8.7–10.3)
Chloride: 104 mmol/L (ref 96–106)
Creatinine, Ser: 1.08 mg/dL — ABNORMAL HIGH (ref 0.57–1.00)
GFR calc Af Amer: 61 mL/min/{1.73_m2} (ref >59)
GFR calc non Af Amer: 53 mL/min/{1.73_m2} — ABNORMAL LOW (ref >59)
Globulin, Total: 2.7 g/dL (ref 1.5–4.5)
Glucose: 89 mg/dL (ref 65–99)
Potassium: 4.3 mmol/L (ref 3.5–5.2)
Sodium: 142 mmol/L (ref 134–144)
Total Protein: 7.1 g/dL (ref 6.0–8.5)

## 2020-05-08 LAB — TSH: TSH: 2.44 u[IU]/mL (ref 0.450–4.500)

## 2020-05-15 ENCOUNTER — Other Ambulatory Visit: Payer: Self-pay

## 2020-05-15 ENCOUNTER — Encounter: Payer: Self-pay | Admitting: Family Medicine

## 2020-05-15 ENCOUNTER — Ambulatory Visit (INDEPENDENT_AMBULATORY_CARE_PROVIDER_SITE_OTHER): Payer: Medicare PPO | Admitting: Family Medicine

## 2020-05-15 VITALS — BP 114/71 | HR 66 | Temp 98.0°F | Ht 62.0 in | Wt 119.0 lb

## 2020-05-15 DIAGNOSIS — L570 Actinic keratosis: Secondary | ICD-10-CM

## 2020-05-15 DIAGNOSIS — Z0001 Encounter for general adult medical examination with abnormal findings: Secondary | ICD-10-CM

## 2020-05-15 DIAGNOSIS — L819 Disorder of pigmentation, unspecified: Secondary | ICD-10-CM

## 2020-05-15 DIAGNOSIS — R7989 Other specified abnormal findings of blood chemistry: Secondary | ICD-10-CM | POA: Diagnosis not present

## 2020-05-15 DIAGNOSIS — Z23 Encounter for immunization: Secondary | ICD-10-CM

## 2020-05-15 DIAGNOSIS — M810 Age-related osteoporosis without current pathological fracture: Secondary | ICD-10-CM | POA: Diagnosis not present

## 2020-05-15 DIAGNOSIS — Z0000 Encounter for general adult medical examination without abnormal findings: Secondary | ICD-10-CM

## 2020-05-15 MED ORDER — IBANDRONATE SODIUM 150 MG PO TABS: 150.0000 mg | ORAL_TABLET | ORAL | 4 refills | Status: DC

## 2020-05-15 NOTE — Patient Instructions (Signed)
Come in at your convenience for recheck lab.  No need to fast for that.   Preventive Care 67 Years and Older, Female Preventive care refers to lifestyle choices and visits with your health care provider that can promote health and wellness. This includes:  A yearly physical exam. This is also called an annual well check.  Regular dental and eye exams.  Immunizations.  Screening for certain conditions.  Healthy lifestyle choices, such as diet and exercise. What can I expect for my preventive care visit? Physical exam Your health care provider will check:  Height and weight. These may be used to calculate body mass index (BMI), which is a measurement that tells if you are at a healthy weight.  Heart rate and blood pressure.  Your skin for abnormal spots. Counseling Your health care provider may ask you questions about:  Alcohol, tobacco, and drug use.  Emotional well-being.  Home and relationship well-being.  Sexual activity.  Eating habits.  History of falls.  Memory and ability to understand (cognition).  Work and work Statistician.  Pregnancy and menstrual history. What immunizations do I need?  Influenza (flu) vaccine  This is recommended every year. Tetanus, diphtheria, and pertussis (Tdap) vaccine  You may need a Td booster every 10 years. Varicella (chickenpox) vaccine  You may need this vaccine if you have not already been vaccinated. Zoster (shingles) vaccine  You may need this after age 65. Pneumococcal conjugate (PCV13) vaccine  One dose is recommended after age 75. Pneumococcal polysaccharide (PPSV23) vaccine  One dose is recommended after age 67. Measles, mumps, and rubella (MMR) vaccine  You may need at least one dose of MMR if you were born in 1957 or later. You may also need a second dose. Meningococcal conjugate (MenACWY) vaccine  You may need this if you have certain conditions. Hepatitis A vaccine  You may need this if you have  certain conditions or if you travel or work in places where you may be exposed to hepatitis A. Hepatitis B vaccine  You may need this if you have certain conditions or if you travel or work in places where you may be exposed to hepatitis B. Haemophilus influenzae type b (Hib) vaccine  You may need this if you have certain conditions. You may receive vaccines as individual doses or as more than one vaccine together in one shot (combination vaccines). Talk with your health care provider about the risks and benefits of combination vaccines. What tests do I need? Blood tests  Lipid and cholesterol levels. These may be checked every 5 years, or more frequently depending on your overall health.  Hepatitis C test.  Hepatitis B test. Screening  Lung cancer screening. You may have this screening every year starting at age 67 if you have a 30-pack-year history of smoking and currently smoke or have quit within the past 15 years.  Colorectal cancer screening. All adults should have this screening starting at age 67 and continuing until age 68. Your health care provider may recommend screening at age 67 if you are at increased risk. You will have tests every 1-10 years, depending on your results and the type of screening test.  Diabetes screening. This is done by checking your blood sugar (glucose) after you have not eaten for a while (fasting). You may have this done every 1-3 years.  Mammogram. This may be done every 1-2 years. Talk with your health care provider about how often you should have regular mammograms.  BRCA-related cancer screening.  This may be done if you have a family history of breast, ovarian, tubal, or peritoneal cancers. Other tests  Sexually transmitted disease (STD) testing.  Bone density scan. This is done to screen for osteoporosis. You may have this done starting at age 67. Follow these instructions at home: Eating and drinking  Eat a diet that includes fresh fruits  and vegetables, whole grains, lean protein, and low-fat dairy products. Limit your intake of foods with high amounts of sugar, saturated fats, and salt.  Take vitamin and mineral supplements as recommended by your health care provider.  Do not drink alcohol if your health care provider tells you not to drink.  If you drink alcohol: ? Limit how much you have to 0-1 drink a day. ? Be aware of how much alcohol is in your drink. In the [place] one drink equals one 12 oz bottle of beer (355 mL), one 5 oz glass of wine (148 mL), or one 1 oz glass of hard liquor (44 mL). Lifestyle  Take daily care of your teeth and gums.  Stay active. Exercise for at least 30 minutes on 5 or more days each week.  Do not use any products that contain nicotine or tobacco, such as cigarettes, e-cigarettes, and chewing tobacco. If you need help quitting, ask your health care provider.  If you are sexually active, practice safe sex. Use a condom or other form of protection in order to prevent STIs (sexually transmitted infections).  Talk with your health care provider about taking a low-dose aspirin or statin. What's next?  Go to your health care provider once a year for a well check visit.  Ask your health care provider how often you should have your eyes and teeth checked.  Stay up to date on all vaccines. This information is not intended to replace advice given to you by your health care provider. Make sure you discuss any questions you have with your health care provider. Document Revised: 10/06/2018 Document Reviewed: 10/06/2018 Elsevier Patient Education  2020 [employer].

## 2020-05-15 NOTE — Addendum Note (Signed)
Addended byCarrolyn Leigh on: 05/15/2020 03:51 PM   Modules accepted: Orders

## 2020-05-15 NOTE — Progress Notes (Signed)
Joanne Sullivan is a 69 y.o. female presents to office today for annual physical exam examination.    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 Allergy   . Glaucoma suspect   . Heart murmur   . Seasonal allergic reaction    Social History   Socioeconomic History  . Marital status: Married    Spouse name: Not on file  . Number of children: Not on file  . Years of education: Not on file  . Highest education level: Not on file  Occupational History  . Not on file  Tobacco Use  . Smoking status: Never Smoker  . Smokeless tobacco: Never Used  Vaping Use  . Vaping Use: Never used  Substance and Sexual Activity  . Alcohol use: No  . Drug use: No  . Sexual activity: Not on file  Other Topics Concern  . Not on file  Social History Narrative  . Not on file   Social Determinants of Health   Financial Resource Strain:   . Difficulty of Paying Living Expenses:   Food Insecurity:   . Worried About Charity fundraiser in the Last Year:   . Arboriculturist in the Last Year:   Transportation Needs:   . Film/video editor (Medical):   Marland Kitchen Lack of Transportation (Non-Medical):   Physical Activity:   . Days of Exercise per Week:   . Minutes of Exercise per Session:   Stress:   . Feeling of Stress :   Social Connections:   . Frequency of Communication with Friends and Family:   . Frequency of Social Gatherings with Friends and Family:   . Attends Religious Services:   . Active Member of Clubs or Organizations:   . Attends Archivist Meetings:   Marland Kitchen Marital Status:   Intimate Partner Violence:   . Fear of Current or  Ex-Partner:   . Emotionally Abused:   Marland Kitchen Physically Abused:   . Sexually Abused:    Past Surgical History:  Procedure Laterality Date  . left radial head fracture Left    Family History  Problem Relation Age of Onset  . Heart disease Mother        2 MIs  . Hypertension Mother   . Heart disease Father 4       AMI  . Cancer Sister        ovarian    Current Outpatient Medications:  .  Calcium Carbonate-Vit D-Min (CALTRATE 600+D PLUS MINERALS) 600-800 MG-UNIT TABS, Take 2 tablets by mouth daily., Disp: , Rfl:  .  ibandronate (BONIVA) 150 MG tablet, Take 1 tablet (150 mg total) by mouth every 30 (thirty) days. Take in the morning with a full glass of water, on an empty stomach, and do not take anything else by mouth or lie down for the next 30 min. (May need to run as brand for ins to cover), Disp: 3 tablet, Rfl: 4  No Known Allergies   ROS: Review of Systems Pertinent items noted in HPI and remainder of comprehensive ROS otherwise negative.    Physical exam BP 114/71   Pulse 66   Temp 98 F (36.7 C) (Temporal)  Ht 5\' 2"  (1.575 m)   Wt 119 lb (54 kg)   SpO2 96%   BMI 21.77 kg/m  General appearance: alert, cooperative, appears stated age and no distress Head: Normocephalic, without obvious abnormality, atraumatic Eyes: negative findings: lids and lashes normal, conjunctivae and sclerae normal and corneas clear Ears: normal TM's and external ear canals both ears Nose: deviated septum.  Throat: lips, mucosa, and tongue normal; teeth and gums normal Neck: no adenopathy, no carotid bruit, supple, symmetrical, trachea midline and thyroid not enlarged, symmetric, no tenderness/mass/nodules Back: symmetric, no curvature. ROM normal. No CVA tenderness. Lungs: clear to auscultation bilaterally  Heart: regular rate and rhythm, S1, S2 normal, no murmur, click, rub or gallop Abdomen: soft, non-tender; bowel sounds normal; no masses,  no organomegaly Extremities: extremities normal,  atraumatic, no cyanosis or edema Pulses: 2+ and symmetric Skin: multiple seborrheic keratosis.  She has an actinic keratosis on the left flank. skin tag noted along left inner thigh.she has several pigmented nevi/ sun damage, cherry hemangiomas along flank Lymph nodes: Cervical, supraclavicular, and axillary nodes normal. Neurologic: Alert and oriented X 3, normal strength and tone. Normal symmetric reflexes. Normal coordination and gait Psych: mood stable, speech normal. Depression screen [facility] 2/9 05/15/2020 04/11/2019 12/29/2017  Decreased Interest 0 0 0  Down, Depressed, Hopeless 0 0 0  PHQ - 2 Score 0 0 0  Altered sleeping 0 0 -  Tired, decreased energy 0 0 -  Change in appetite 0 0 -  Feeling bad or failure about yourself  0 0 -  Trouble concentrating 0 0 -  Moving slowly or fidgety/restless 0 0 -  Suicidal thoughts 0 0 -  PHQ-9 Score 0 0 -   Cryotherapy Procedure:  Risks and benefits of procedure were reviewed with the patient.  Written consent obtained and scanned into the chart.  Lesion of concern was identified and located on left flank.  Liquid nitrogen was applied to area of concern and extending out 1 millimeters beyond the border of the lesion.  Treated area was allowed to come back to room temperature before treating it a second time.  Patient tolerated procedure well and there were no immediate complications.  Home care instructions were reviewed with the patient and a handout was provided.  Assessment/ Plan: Joanne Sullivan Abend here for annual physical exam.   1. Annual physical exam  2. Age-related osteoporosis without current pathological fracture Last T score -2.8 in 12/2017. Plan for DEXA at outside facility as ours is nonfunctional currently.  Boniva renewed.  She will come in for repeat kidney function test and vitamin D level - ibandronate (BONIVA) 150 MG tablet; Take 1 tablet (150 mg total) by mouth every 30 (thirty) days. Take in the morning with a full  glass of water, on an empty stomach, and do not take anything else by mouth or lie down for the next 30 min. (May need to run as brand for ins to cover)  Dispense: 3 tablet; Refill: 4 - VITAMIN D 25 Hydroxy (Vit-D Deficiency, Fractures); Future - DG Bone Density; Future  3. Actinic keratosis Treated with cryotherapy.  Will place referral to dermatology for full skin exam and evaluation under dermatoscope given multiple areas of pigmentation - Ambulatory referral to Dermatology  4. Elevated serum creatinine ?  Due to dehydration.  May need to consider alternative therapy than bisphosphonates if persistent impairment in renal function - Basic metabolic panel; Future  5. Pigmented skin lesion - Ambulatory referral to Dermatology  Handout on healthy lifestyle choices, including diet (rich in fruits, vegetables and lean meats and low in salt and simple carbohydrates) and exercise (at least 30 minutes of moderate physical activity daily).  Patient to follow up in 1 year for annual exam or sooner if needed.  Jaeline Whobrey M. Lajuana Ripple, DO

## 2020-05-21 ENCOUNTER — Other Ambulatory Visit: Payer: Self-pay

## 2020-05-21 ENCOUNTER — Other Ambulatory Visit: Payer: Medicare PPO

## 2020-05-21 DIAGNOSIS — R7989 Other specified abnormal findings of blood chemistry: Secondary | ICD-10-CM

## 2020-05-21 DIAGNOSIS — M810 Age-related osteoporosis without current pathological fracture: Secondary | ICD-10-CM

## 2020-05-22 ENCOUNTER — Other Ambulatory Visit: Payer: Self-pay | Admitting: Family Medicine

## 2020-05-22 ENCOUNTER — Telehealth: Payer: Self-pay | Admitting: Family Medicine

## 2020-05-22 LAB — BASIC METABOLIC PANEL
BUN/Creatinine Ratio: 19 (ref 12–28)
BUN: 22 mg/dL (ref 8–27)
CO2: 24 mmol/L (ref 20–29)
Calcium: 9 mg/dL (ref 8.7–10.3)
Chloride: 98 mmol/L (ref 96–106)
Creatinine, Ser: 1.18 mg/dL — ABNORMAL HIGH (ref 0.57–1.00)
GFR calc Af Amer: 55 mL/min/{1.73_m2} — ABNORMAL LOW (ref >59)
GFR calc non Af Amer: 48 mL/min/{1.73_m2} — ABNORMAL LOW (ref >59)
Glucose: 88 mg/dL (ref 65–99)
Potassium: 4.1 mmol/L (ref 3.5–5.2)
Sodium: 138 mmol/L (ref 134–144)

## 2020-05-22 LAB — VITAMIN D 25 HYDROXY (VIT D DEFICIENCY, FRACTURES): Vit D, 25-Hydroxy: 66.2 ng/mL (ref 30.0–100.0)

## 2020-05-27 ENCOUNTER — Telehealth: Payer: Self-pay | Admitting: Family Medicine

## 2020-05-27 NOTE — Telephone Encounter (Signed)
DEXA ordered to be performed at outside facility.  Melissa, do you know the status of this that you can relay to pt?  I have not received any results so I assume it has not been completed.

## 2020-06-03 ENCOUNTER — Other Ambulatory Visit: Payer: Self-pay

## 2020-06-03 ENCOUNTER — Ambulatory Visit (HOSPITAL_COMMUNITY)
Admission: RE | Admit: 2020-06-03 | Discharge: 2020-06-03 | Disposition: A | Discharge status: Home / Self Care | Payer: Medicare PPO | Source: Ambulatory Visit | Attending: Family Medicine | Admitting: Family Medicine

## 2020-06-03 DIAGNOSIS — M810 Age-related osteoporosis without current pathological fracture: Secondary | ICD-10-CM | POA: Insufficient documentation

## 2020-06-12 ENCOUNTER — Emergency Department (HOSPITAL_COMMUNITY)
Admission: EM | Admit: 2020-06-12 | Discharge: 2020-06-12 | Disposition: A | Discharge status: Home / Self Care | Payer: Medicare PPO | Attending: Emergency Medicine | Admitting: Emergency Medicine

## 2020-06-12 ENCOUNTER — Encounter (HOSPITAL_COMMUNITY): Payer: Self-pay | Admitting: Emergency Medicine

## 2020-06-12 DIAGNOSIS — R55 Syncope and collapse: Secondary | ICD-10-CM | POA: Diagnosis not present

## 2020-06-12 LAB — CBC
HCT: 37.4 % (ref 36.0–46.0)
Hemoglobin: 11.9 g/dL — ABNORMAL LOW (ref 12.0–15.0)
MCH: 30.2 pg (ref 26.0–34.0)
MCHC: 31.8 g/dL (ref 30.0–36.0)
MCV: 94.9 fL (ref 80.0–100.0)
Platelets: 146 10*3/uL — ABNORMAL LOW (ref 150–400)
RBC: 3.94 MIL/uL (ref 3.87–5.11)
RDW: 12.9 % (ref 11.5–15.5)
WBC: 5.4 10*3/uL (ref 4.0–10.5)
nRBC: 0 % (ref 0.0–0.2)

## 2020-06-12 LAB — BASIC METABOLIC PANEL
Anion gap: 7 (ref 5–15)
BUN: 16 mg/dL (ref 8–23)
CO2: 25 mmol/L (ref 22–32)
Calcium: 8.5 mg/dL — ABNORMAL LOW (ref 8.9–10.3)
Chloride: 107 mmol/L (ref 98–111)
Creatinine, Ser: 0.92 mg/dL (ref 0.44–1.00)
GFR calc Af Amer: >60 mL/min (ref >60)
GFR calc non Af Amer: >60 mL/min (ref >60)
Glucose, Bld: 105 mg/dL — ABNORMAL HIGH (ref 70–99)
Potassium: 4.1 mmol/L (ref 3.5–5.1)
Sodium: 139 mmol/L (ref 135–145)

## 2020-06-12 LAB — CBG MONITORING, ED: Glucose-Capillary: 83 mg/dL (ref 70–99)

## 2020-06-12 NOTE — ED Notes (Signed)
Sugar noted to be 83- pt reports this has happened in the past and she feels better   Meal provided, banana and juice

## 2020-06-12 NOTE — Discharge Instructions (Signed)
You are seen today for passing out, this is most likely since you gave blood this morning.  Your work-up was reassuring.  I want you to follow-up with your primary care in the next couple of days.  Try to avoid driving until seeing primary care.  Also try to avoid physical activity after giving blood if you are going to give blood again.  If you have any chest pain, shortness of breath, passing out again please come back to the emergency department.  Also if you have any new or worsening concerning symptoms, to the emergency department.  Please use the attached instructions.  Make sure you stay hydrated and eat today.

## 2020-06-12 NOTE — ED Notes (Signed)
Pt out of bed to bathroom   Ambulates heel to toe without stagger nor drift   Nor complaint of dizziness

## 2020-06-12 NOTE — ED Notes (Signed)
Awaiting Dr Earnest Conroy to assess per PA

## 2020-06-12 NOTE — ED Provider Notes (Addendum)
EKG [facility] EMERGENCY DEPARTMENT Provider Note   CSN: [id number] Arrival date & time: 06/12/20  1224     History Chief Complaint  Patient presents with  . Loss of Consciousness    after giving blood    Joanne Sullivan is a 69 y.o. female with pertinent past medical history of heart murmur, allergies that presents to the emergency department today for syncope.  States that she gave blood yesterday, when she was at the gym today she had a syncopal event, states that she was sitting in the chair and lifting weights in her [employer] gym class and passed out while sitting up.  Did not hit her head.  Did not fall out of her chair.  States that this was only for couple seconds.  States that she has had this occur to her in the past while giving blood.  Per EMS report blood pressure was 70/40 and was orthostatic, they did give her 400 cc of normal saline.    States that she did feel slightly lightheaded before passing out, denies any nausea, vision changes, palpitations, chest pain, shortness of breath before passing out or after passing out.  States that she was feeling like this yesterday after she gave blood as well, states that she felt slightly better this morning therefore went to the gym.  States that she remembered everything happened when she came to.  States that he came to in a couple seconds.  People around her denied any seizure-like activity.  Denies any symptoms currently.  Denies any fevers, chills, URI symptoms, cardiac history, shortness of breath.  States that she did have a light breakfast this morning, did not eat as much as she normally does.  HPI     Past Medical History:  Diagnosis Date  . Allergy   . Glaucoma suspect   . Heart murmur   . Seasonal allergic reaction     Patient Active Problem List   Diagnosis Date Noted  . Age-related osteoporosis without current pathological fracture 04/11/2019  . Axillary lymphadenopathy 05/03/2017  . Annual physical exam  08/04/2013  . Seasonal allergic reaction   . Heart murmur   . Glaucoma suspect     Past Surgical History:  Procedure Laterality Date  . left radial head fracture Left      OB History   No obstetric history on file.     Family History  Problem Relation Age of Onset  . Heart disease Mother        2 MIs  . Hypertension Mother   . Heart disease Father 37       AMI  . Cancer Sister        ovarian    Social History   Tobacco Use  . Smoking status: Never Smoker  . Smokeless tobacco: Never Used  Vaping Use  . Vaping Use: Never used  Substance Use Topics  . Alcohol use: No  . Drug use: No    Home Medications Prior to Admission medications   Medication Sig Start Date End Date Taking? Authorizing Provider  Calcium Carbonate-Vit D-Min (CALTRATE 600+D PLUS MINERALS) 600-800 MG-UNIT TABS Take 2 tablets by mouth daily.    [provider]  ibandronate (BONIVA) 150 MG tablet Take 1 tablet (150 mg total) by mouth every 30 (thirty) days. Take in the morning with a full glass of water, on an empty stomach, and do not take anything else by mouth or lie down for the next 30 min. (May need to run  as brand for ins to cover) 05/15/20   Janora Norlander, DO    Allergies    Patient has no known allergies.  Review of Systems   Review of Systems  Constitutional: Negative for chills, diaphoresis, fatigue and fever.  HENT: Negative for congestion, sore throat and trouble swallowing.   Eyes: Negative for pain and visual disturbance.  Respiratory: Negative for cough, shortness of breath and wheezing.   Cardiovascular: Negative for chest pain, palpitations and leg swelling.  Gastrointestinal: Negative for abdominal distention, abdominal pain, diarrhea, nausea and vomiting.  Genitourinary: Negative for difficulty urinating.  Musculoskeletal: Negative for back pain, neck pain and neck stiffness.  Skin: Negative for pallor.  Neurological: Positive for syncope. Negative for  dizziness, speech difficulty, weakness and headaches.  Psychiatric/Behavioral: Negative for confusion.    Physical Exam Updated Vital Signs BP 108/64   Pulse 70   Temp 97.9 F (36.6 C) (Oral)   Resp 18   Ht 5\' 2"  (1.575 m)   Wt 54 kg   SpO2 100%   BMI 21.77 kg/m   Physical Exam Constitutional:      General: She is not in acute distress.    Appearance: Normal appearance. She is not ill-appearing, toxic-appearing or diaphoretic.  HENT:     Mouth/Throat:     Mouth: Mucous membranes are moist.     Pharynx: Oropharynx is clear.  Eyes:     General: No scleral icterus.    Extraocular Movements: Extraocular movements intact.     Pupils: Pupils are equal, round, and reactive to light.  Cardiovascular:     Rate and Rhythm: Normal rate and regular rhythm.     Pulses: Normal pulses.     Heart sounds: Normal heart sounds.  Pulmonary:     Effort: Pulmonary effort is normal. No respiratory distress.     Breath sounds: Normal breath sounds. No stridor. No wheezing, rhonchi or rales.  Chest:     Chest wall: No tenderness.  Abdominal:     General: Abdomen is flat. There is no distension.     Palpations: Abdomen is soft.     Tenderness: There is no abdominal tenderness. There is no guarding or rebound.  Musculoskeletal:        General: No swelling or tenderness. Normal range of motion.     Cervical back: Normal range of motion and neck supple. No rigidity.     Right lower leg: No edema.     Left lower leg: No edema.  Skin:    General: Skin is warm and dry.     Capillary Refill: Capillary refill takes less than 2 seconds.     Coloration: Skin is not pale.  Neurological:     General: No focal deficit present.     Mental Status: She is alert and oriented to person, place, and time.     Comments: Alert. Clear speech. No facial droop. CNIII-XII grossly intact. Bilateral upper and lower extremities' sensation grossly intact. 5/5 symmetric strength with grip strength and with plantar and  dorsi flexion bilaterally. Normal finger to nose bilaterally. Negative pronator drift. Negative Romberg sign. Gait is steady and intact   Psychiatric:        Mood and Affect: Mood normal.        Behavior: Behavior normal.     ED Results / Procedures / Treatments   Labs (all labs ordered are listed, but only abnormal results are displayed) Labs Reviewed  BASIC METABOLIC PANEL - Abnormal; Notable for the following  components:      Result Value   Glucose, Bld 105 (*)    Calcium 8.5 (*)    All other components within normal limits  CBC - Abnormal; Notable for the following components:   Hemoglobin 11.9 (*)    Platelets 146 (*)    All other components within normal limits  CBG MONITORING, ED    EKG None  Radiology No results found.  Procedures Procedures (including critical care time)  Medications Ordered in ED Medications - No data to display  ED Course  I have reviewed the triage vital signs and the nursing notes.  Pertinent labs & imaging results that were available during my care of the patient were reviewed by me and considered in my medical decision making (see chart for details).    MDM Rules/Calculators/A&P                          Joanne Sullivan is a 69 y.o. female with pertinent past medical history of heart murmur, allergies that presents to the emergency department today for syncope.  According to triage note patient was orthostatic and hypotensive, was able to come back to normotensive with normal vitals after 400 cc of saline.  I think that patient most likely passed out from being volume depleted from giving blood and then going to gym class.  Most likely vasovagal syncope.  Has had this happen to her before after giving blood. Pt back to normal now vitals 116/76. States she feels good. Patient does have good follow-up with PCP.  Negative [place] Syncope Rule.  CBC and CMP stable.  Patient has been observed for over 3 hours now.  States that she  feels fine and wants to go home.  Did not hit her head.  Not complaining anything.  EKG without any signs of ischemia or arrhythmia.  Patient without arrhythmia or tachycardia while here in the department.  Patient without history of congestive heart failure, normal hematocrit, normal ECG, no shortness of breath and systolic blood pressure greater than 90; patient is low risk. Will plan for discharge home with close cardiology/PCP follow-up.  Possibility of recurrent syncope has been discussed. I discussed reasons to avoid driving until cardiology/PCP followup and other safety prevention including use of ladders and working at heights.   Doubt need for further emergent work up at this time. I explained the diagnosis and have given explicit precautions to return to the ER including for any other new or worsening symptoms. The patient understands and accepts the medical plan as it's been dictated and I have answered their questions. Discharge instructions concerning home care and prescriptions have been given. The patient is STABLE and is discharged to home in good condition.  I discussed this case with my attending physician who cosigned this note including patient's presenting symptoms, physical exam, and planned diagnostics and interventions. Attending physician stated agreement with plan or made changes to plan which were implemented.   Attending physician assessed patient at bedside.   Final Clinical Impression(s) / ED Diagnoses Final diagnoses:  Vasovagal syncope    Rx / DC Orders ED Discharge Orders    None             Alfredia Client, PA-C 06/12/20 1544    Fredia Sorrow, MD 06/13/20 [phone]

## 2020-06-12 NOTE — ED Triage Notes (Signed)
Pt gave blood yesterday   Has had syncope and orthostatic hypotension from same in past   Today at gym syncope with assist to floor  No hitting of head   Orthostatic   IV established, NS bolus of 400 cc  CBG 117, BP 70/40   Followed by [facility]  Here for eval

## 2020-06-26 DIAGNOSIS — D1801 Hemangioma of skin and subcutaneous tissue: Secondary | ICD-10-CM | POA: Diagnosis not present

## 2020-06-26 DIAGNOSIS — L814 Other melanin hyperpigmentation: Secondary | ICD-10-CM | POA: Insufficient documentation

## 2020-06-26 DIAGNOSIS — D229 Melanocytic nevi, unspecified: Secondary | ICD-10-CM | POA: Insufficient documentation

## 2020-06-26 DIAGNOSIS — I781 Nevus, non-neoplastic: Secondary | ICD-10-CM | POA: Diagnosis not present

## 2020-06-26 DIAGNOSIS — L821 Other seborrheic keratosis: Secondary | ICD-10-CM | POA: Diagnosis not present

## 2020-07-05 DIAGNOSIS — Z1231 Encounter for screening mammogram for malignant neoplasm of breast: Secondary | ICD-10-CM | POA: Diagnosis not present

## 2020-07-17 DIAGNOSIS — R928 Other abnormal and inconclusive findings on diagnostic imaging of breast: Secondary | ICD-10-CM

## 2020-08-07 DIAGNOSIS — R928 Other abnormal and inconclusive findings on diagnostic imaging of breast: Secondary | ICD-10-CM | POA: Diagnosis not present

## 2020-08-07 DIAGNOSIS — R922 Inconclusive mammogram: Secondary | ICD-10-CM | POA: Diagnosis not present

## 2020-08-28 ENCOUNTER — Other Ambulatory Visit: Payer: BLUE CROSS/BLUE SHIELD

## 2020-12-02 DIAGNOSIS — H40013 Open angle with borderline findings, low risk, bilateral: Secondary | ICD-10-CM | POA: Diagnosis not present

## 2021-05-21 ENCOUNTER — Telehealth: Payer: Self-pay | Admitting: Family Medicine

## 2021-05-21 ENCOUNTER — Other Ambulatory Visit: Payer: Self-pay | Admitting: Family Medicine

## 2021-05-21 DIAGNOSIS — Z1329 Encounter for screening for other suspected endocrine disorder: Secondary | ICD-10-CM

## 2021-05-21 DIAGNOSIS — D649 Anemia, unspecified: Secondary | ICD-10-CM

## 2021-05-21 DIAGNOSIS — M810 Age-related osteoporosis without current pathological fracture: Secondary | ICD-10-CM

## 2021-05-21 DIAGNOSIS — Z13220 Encounter for screening for lipoid disorders: Secondary | ICD-10-CM

## 2021-05-21 NOTE — Telephone Encounter (Signed)
done

## 2021-05-22 NOTE — Telephone Encounter (Signed)
Patient notified

## 2021-05-26 ENCOUNTER — Other Ambulatory Visit: Payer: Medicare PPO

## 2021-05-26 ENCOUNTER — Other Ambulatory Visit: Payer: Self-pay

## 2021-05-26 DIAGNOSIS — M810 Age-related osteoporosis without current pathological fracture: Secondary | ICD-10-CM | POA: Diagnosis not present

## 2021-05-26 DIAGNOSIS — D649 Anemia, unspecified: Secondary | ICD-10-CM | POA: Diagnosis not present

## 2021-05-26 DIAGNOSIS — Z1329 Encounter for screening for other suspected endocrine disorder: Secondary | ICD-10-CM | POA: Diagnosis not present

## 2021-05-26 DIAGNOSIS — Z13220 Encounter for screening for lipoid disorders: Secondary | ICD-10-CM | POA: Diagnosis not present

## 2021-05-27 LAB — CMP14+EGFR
ALT: 20 IU/L (ref 0–32)
AST: 24 IU/L (ref 0–40)
Albumin/Globulin Ratio: 1.8 (ref 1.2–2.2)
Albumin: 4.3 g/dL (ref 3.8–4.8)
Alkaline Phosphatase: 95 IU/L (ref 44–121)
BUN/Creatinine Ratio: 15 (ref 12–28)
BUN: 16 mg/dL (ref 8–27)
Bilirubin Total: 0.4 mg/dL (ref 0.0–1.2)
CO2: 28 mmol/L (ref 20–29)
Calcium: 9 mg/dL (ref 8.7–10.3)
Chloride: 102 mmol/L (ref 96–106)
Creatinine, Ser: 1.06 mg/dL — ABNORMAL HIGH (ref 0.57–1.00)
Globulin, Total: 2.4 g/dL (ref 1.5–4.5)
Glucose: 91 mg/dL (ref 65–99)
Potassium: 4.2 mmol/L (ref 3.5–5.2)
Sodium: 141 mmol/L (ref 134–144)
Total Protein: 6.7 g/dL (ref 6.0–8.5)
eGFR: 57 mL/min/{1.73_m2} — ABNORMAL LOW (ref >59)

## 2021-05-27 LAB — LIPID PANEL
Chol/HDL Ratio: 1.5 ratio (ref 0.0–4.4)
Cholesterol, Total: 114 mg/dL (ref 100–199)
HDL: 74 mg/dL (ref >39)
LDL Chol Calc (NIH): 23 mg/dL (ref 0–99)
Triglycerides: 91 mg/dL (ref 0–149)
VLDL Cholesterol Cal: 17 mg/dL (ref 5–40)

## 2021-05-27 LAB — CBC
Hematocrit: 43.3 % (ref 34.0–46.6)
Hemoglobin: 14.2 g/dL (ref 11.1–15.9)
MCH: 29.6 pg (ref 26.6–33.0)
MCHC: 32.8 g/dL (ref 31.5–35.7)
MCV: 90 fL (ref 79–97)
Platelets: 186 10*3/uL (ref 150–450)
RBC: 4.79 x10E6/uL (ref 3.77–5.28)
RDW: 12.5 % (ref 11.7–15.4)
WBC: 3.8 10*3/uL (ref 3.4–10.8)

## 2021-05-27 LAB — TSH: TSH: 3.56 u[IU]/mL (ref 0.450–4.500)

## 2021-05-27 LAB — VITAMIN D 25 HYDROXY (VIT D DEFICIENCY, FRACTURES): Vit D, 25-Hydroxy: 100 ng/mL (ref 30.0–100.0)

## 2021-05-29 ENCOUNTER — Ambulatory Visit (INDEPENDENT_AMBULATORY_CARE_PROVIDER_SITE_OTHER): Payer: Medicare PPO

## 2021-05-29 VITALS — Ht 62.0 in | Wt 119.0 lb

## 2021-05-29 DIAGNOSIS — Z0000 Encounter for general adult medical examination without abnormal findings: Secondary | ICD-10-CM

## 2021-05-29 NOTE — Progress Notes (Signed)
Subjective:   Kewanda Schwegel is a 70 y.o. female who presents for an Initial Medicare Annual Wellness Visit.  Virtual Visit via Telephone Note  I connected with  Latrivia Fitzke on 05/29/21 at  4:15 PM EDT by telephone and verified that I am speaking with the correct person using two identifiers.  Location: Patient: Home Provider: [facility] Persons participating in the virtual visit: patient/Nurse Health Advisor   I discussed the limitations, risks, security and privacy concerns of performing an evaluation and management service by telephone and the availability of in person appointments. The patient expressed understanding and agreed to proceed.  Interactive audio and video telecommunications were attempted between this nurse and patient, however failed, due to patient having technical difficulties OR patient did not have access to video capability.  We continued and completed visit with audio only.  Some vital signs may be absent or patient reported.   Vickie Melnik E Eunice Oldaker, LPN   Review of Systems     Cardiac Risk Factors include: advanced age (>46mn, >>46women)     Objective:    Today's Vitals   05/29/21 1615  Weight: 119 lb (54 kg)  Height: '5\' 2"'$  (1.575 m)   Body mass index is 21.77 kg/m.  Advanced Directives 05/29/2021  Does Patient Have a Medical Advance Directive? No  Would patient like information on creating a medical advance directive? Yes (MAU/Ambulatory/Procedural Areas - Information given)    Current Medications (verified) Outpatient Encounter Medications as of 05/29/2021  Medication Sig   Calcium Carbonate-Vit D-Min (CALTRATE 600+D PLUS MINERALS) 600-800 MG-UNIT TABS Take 2 tablets by mouth daily.   ibandronate (BONIVA) 150 MG tablet Take 1 tablet (150 mg total) by mouth every 30 (thirty) days. Take in the morning with a full glass of water, on an empty stomach, and do not take anything else by mouth or lie down for the next 30 min. (May need to run as brand  for ins to cover)   No facility-administered encounter medications on file as of 05/29/2021.    Allergies (verified) Patient has no known allergies.   History: Past Medical History:  Diagnosis Date   Allergy    Glaucoma suspect    Heart murmur    Seasonal allergic reaction    Past Surgical History:  Procedure Laterality Date   left radial head fracture Left    Family History  Problem Relation Age of Onset   Heart disease Mother        2 MIs   Hypertension Mother    Heart disease Father 673      AMI   Cancer Sister        ovarian   Social History   Socioeconomic History   Marital status: Married    Spouse name: KYvone Neu  Number of children: 1   Years of education: Not on file   Highest education level: Not on file  Occupational History   Occupation: retired  Tobacco Use   Smoking status: Never   Smokeless tobacco: Never  Vaping Use   Vaping Use: Never used  Substance and Sexual Activity   Alcohol use: No   Drug use: No   Sexual activity: Not on file  Other Topics Concern   Not on file  Social History Narrative   Lives home with husband. Son lives in [place]   Social Determinants of Health   Financial Resource Strain: Low Risk    Difficulty of Paying Living Expenses: Not hard at all  Food Insecurity: No  Food Insecurity   Worried About Charity fundraiser in the Last Year: Never true   Ran Out of Food in the Last Year: Never true  Transportation Needs: No Transportation Needs   Lack of Transportation (Medical): No   Lack of Transportation (Non-Medical): No  Physical Activity: Sufficiently Active   Days of Exercise per Week: 6 days   Minutes of Exercise per Session: 60 min  Stress: No Stress Concern Present   Feeling of Stress : Only a little  Social Connections: Engineer, building services of Communication with Friends and Family: More than three times a week   Frequency of Social Gatherings with Friends and Family: More than three times a week    Attends Religious Services: 1 to 4 times per year   Active Member of [employer] or Organizations: Yes   Attends Music therapist: More than 4 times per year   Marital Status: Married    Tobacco Counseling Counseling given: Not Answered   Clinical Intake:  Pre-visit preparation completed: Yes  Pain : No/denies pain     BMI - recorded: 21.77 Nutritional Status: BMI of 19-24  Normal Nutritional Risks: None Diabetes: No  How often do you need to have someone help you when you read instructions, pamphlets, or other written materials from your doctor or pharmacy?: 1 - Never  Diabetic? No  Interpreter Needed?: No  Information entered by :: Naylene Foell, LPN   Activities of Daily Living In your present state of health, do you have any difficulty performing the following activities: 05/29/2021  Hearing? N  Vision? N  Difficulty concentrating or making decisions? N  Walking or climbing stairs? N  Dressing or bathing? N  Doing errands, shopping? N  Preparing Food and eating ? N  Using the Toilet? N  In the past six months, have you accidently leaked urine? N  Do you have problems with loss of bowel control? N  Managing your Medications? N  Managing your Finances? N  Housekeeping or managing your Housekeeping? N  Some recent data might be hidden    Patient Care Team: Janora Norlander, DO as PCP - General (Family Medicine)  Indicate any recent Medical Services you may have received from other than [facility] providers in the past year (date may be approximate).     Assessment:   This is a routine wellness examination for [place].  Hearing/Vision screen Hearing Screening - Comments:: Denies hearing difficulties  Vision Screening - Comments:: Wears contacts - up to date with annual eye exam - sees Dr Bethann Goo in [place] at [place] issues and exercise activities discussed: Current Exercise Habits: Home exercise routine, Type of exercise:  walking;strength training/weights;calisthenics (Line dancing, [employer]), Time (Minutes): 60, Frequency (Times/Week): 6, Weekly Exercise (Minutes/Week): 360, Intensity: Moderate, Exercise limited by: None identified   Goals Addressed             This Visit's Progress    DIET - INCREASE WATER INTAKE         Depression Screen PHQ 2/9 Scores 05/29/2021 05/15/2020 04/11/2019 12/29/2017 04/23/2017 03/30/2017 01/26/2017  PHQ - 2 Score 0 0 0 0 0 0 0  PHQ- 9 Score - 0 0 - - - -    Fall Risk Fall Risk  05/29/2021 05/15/2020 04/11/2019 12/29/2017 04/23/2017  Falls in the past year? 0 1 0 No No  Number falls in past yr: 0 0 - - -  Injury with Fall? 0 0 - - -  Risk for fall due to : Impaired vision History of fall(s) - - -  Follow up Falls prevention discussed Falls prevention discussed - - -    FALL RISK PREVENTION PERTAINING TO THE HOME:  Any stairs in or around the home? Yes  If so, are there any without handrails? No  Home free of loose throw rugs in walkways, pet beds, electrical cords, etc? Yes  Adequate lighting in your home to reduce risk of falls? Yes   ASSISTIVE DEVICES UTILIZED TO PREVENT FALLS:  Life alert? No  Use of a cane, walker or w/c? No  Grab bars in the bathroom? Yes  Shower chair or bench in shower? Yes  Elevated toilet seat or a handicapped toilet? No   TIMED UP AND GO:  Was the test performed? No . Telephonic visit.  Cognitive Function:     6CIT Screen 05/29/2021  What Year? 0 points  What month? 0 points  What time? 0 points  Count back from 20 0 points  Months in reverse 0 points  Repeat phrase 0 points  Total Score 0    Immunizations Immunization History  Administered Date(s) Administered   Influenza,inj,Quad PF,6+ Mos 08/04/2013, 10/30/2016   Moderna Sars-Covid-2 Vaccination 11/24/2019, 12/20/2019   Pneumococcal Conjugate-13 04/11/2019   Pneumococcal Polysaccharide-23 05/15/2020   Tdap 07/24/2011    TDAP status: Up to date  Flu Vaccine  status: Due, Education has been provided regarding the importance of this vaccine. Advised may receive this vaccine at local pharmacy or Health Dept. Aware to provide a copy of the vaccination record if obtained from local pharmacy or Health Dept. Verbalized acceptance and understanding.  Pneumococcal vaccine status: Up to date  Covid-19 vaccine status: Completed vaccines  Qualifies for Shingles Vaccine? No   Zostavax completed No   Shingrix Completed?: No.    Education has been provided regarding the importance of this vaccine. Patient has been advised to call insurance company to determine out of pocket expense if they have not yet received this vaccine. Advised may also receive vaccine at local pharmacy or Health Dept. Verbalized acceptance and understanding.  Screening Tests Health Maintenance  Topic Date Due   Zoster Vaccines- Shingrix (1 of 2) Never done   COVID-19 Vaccine (3 - Moderna risk series) 01/17/2020   INFLUENZA VACCINE  05/26/2021   TETANUS/TDAP  07/23/2021   MAMMOGRAM  07/05/2022   COLONOSCOPY (Pts 45-55yr Insurance coverage will need to be confirmed)  03/26/2027   DEXA SCAN  Completed   Hepatitis C Screening  Completed   PNA vac Low Risk Adult  Completed   HPV VACCINES  Aged Out    Health Maintenance  Health Maintenance Due  Topic Date Due   Zoster Vaccines- Shingrix (1 of 2) Never done   COVID-19 Vaccine (3 - Moderna risk series) 01/17/2020   INFLUENZA VACCINE  05/26/2021    Colorectal cancer screening: Type of screening: Colonoscopy. Completed 03/25/2017. Repeat every 1 years  Mammogram status: Completed 07/05/2020. [facility] every [facility]  Bone Density status: Completed 06/03/2020. Results reflect: Bone density results: OSTEOPOROSIS. Repeat every 2 years.  Lung Cancer Screening: (Low Dose CT Chest recommended if Age 68-80years, 30 pack-year currently smoking OR have quit w/in 15years.) does not qualify.   Additional  Screening:  Hepatitis C Screening: does qualify; Completed 01/25/2018  Vision Screening: Recommended annual ophthalmology exams for early detection of glaucoma and other disorders of the eye. Is the patient up to date with  their annual eye exam?  Yes  Who is the provider or what is the name of the office in which the patient attends annual eye exams? [facility] in [place] If pt is not established with a provider, would they like to be referred to a provider to establish care? No .   Dental Screening: Recommended annual dental exams for proper oral hygiene  Community Resource Referral / Chronic Care Management: CRR required this visit?  No   CCM required this visit?  No      Plan:     I have personally reviewed and noted the following in the patient's chart:   Medical and social history Use of alcohol, tobacco or illicit drugs  Current medications and supplements including opioid prescriptions. Patient is not currently taking opioid prescriptions. Functional ability and status Nutritional status Physical activity Advanced directives List of other physicians Hospitalizations, surgeries, and ER visits in previous 12 months Vitals Screenings to include cognitive, depression, and falls Referrals and appointments  In addition, I have reviewed and discussed with patient certain preventive protocols, quality metrics, and best practice recommendations. A written personalized care plan for preventive services as well as general preventive health recommendations were provided to patient.     Sandrea Hammond, LPN   [id number]   Nurse Notes: None

## 2021-05-29 NOTE — Patient Instructions (Signed)
Joanne Sullivan , Thank you for taking time to come for your Medicare Wellness Visit. I appreciate your ongoing commitment to your health goals. Please review the following plan we discussed and let me know if I can assist you in the future.   Screening recommendations/referrals: Colonoscopy: Done 03/25/2017 - Repeat in 10 years  Mammogram: Done 08/07/2020 - Repeat annually Bone Density: Done 06/03/2020 - Repeat every 2 years Recommended yearly ophthalmology/optometry visit for glaucoma screening and checkup Recommended yearly dental visit for hygiene and checkup  Vaccinations: Influenza vaccine: Due every fall Pneumococcal vaccine: Done 04/11/2019 & 05/15/2020 Tdap vaccine: done 07/24/2011 - Repeat in 10 years Shingles vaccine: Due. Shingrix discussed. Please contact your pharmacy for coverage information.     Covid-19:Done 11/24/19 & 12/20/19  Advanced directives: Advance directive discussed with you today. I have provided a copy for you to complete at home and have notarized. Once this is complete please bring a copy in to our office so we can scan it into your chart.   Conditions/risks identified: Keep up the good work!  Next appointment: Follow up in one year for your annual wellness visit    Preventive Care 65 Years and Older, Female Preventive care refers to lifestyle choices and visits with your health care provider that can promote health and wellness. What does preventive care include? A yearly physical exam. This is also called an annual well check. Dental exams once or twice a year. Routine eye exams. Ask your health care provider how often you should have your eyes checked. Personal lifestyle choices, including: Daily care of your teeth and gums. Regular physical activity. Eating a healthy diet. Avoiding tobacco and drug use. Limiting alcohol use. Practicing safe sex. Taking low-dose aspirin every day. Taking vitamin and mineral supplements as recommended by your health  care provider. What happens during an annual well check? The services and screenings done by your health care provider during your annual well check will depend on your age, overall health, lifestyle risk factors, and family history of disease. Counseling  Your health care provider may ask you questions about your: Alcohol use. Tobacco use. Drug use. Emotional well-being. Home and relationship well-being. Sexual activity. Eating habits. History of falls. Memory and ability to understand (cognition). Work and work Statistician. Reproductive health. Screening  You may have the following tests or measurements: Height, weight, and BMI. Blood pressure. Lipid and cholesterol levels. These may be checked every 5 years, or more frequently if you are over 55 years old. Skin check. Lung cancer screening. You may have this screening every year starting at age 42 if you have a 30-pack-year history of smoking and currently smoke or have quit within the past 15 years. Fecal occult blood test (FOBT) of the stool. You may have this test every year starting at age 27. Flexible sigmoidoscopy or colonoscopy. You may have a sigmoidoscopy every 5 years or a colonoscopy every 10 years starting at age 97. Hepatitis C blood test. Hepatitis B blood test. Sexually transmitted disease (STD) testing. Diabetes screening. This is done by checking your blood sugar (glucose) after you have not eaten for a while (fasting). You may have this done every 1-3 years. Bone density scan. This is done to screen for osteoporosis. You may have this done starting at age 55. Mammogram. This may be done every 1-2 years. Talk to your health care provider about how often you should have regular mammograms. Talk with your health care provider about your test results, treatment options, and if  necessary, the need for more tests. Vaccines  Your health care provider may recommend certain vaccines, such as: Influenza vaccine. This is  recommended every year. Tetanus, diphtheria, and acellular pertussis (Tdap, Td) vaccine. You may need a Td booster every 10 years. Zoster vaccine. You may need this after age 53. Pneumococcal 13-valent conjugate (PCV13) vaccine. One dose is recommended after age 79. Pneumococcal polysaccharide (PPSV23) vaccine. One dose is recommended after age 93. Talk to your health care provider about which screenings and vaccines you need and how often you need them. This information is not intended to replace advice given to you by your health care provider. Make sure you discuss any questions you have with your health care provider. Document Released: 11/08/2015 Document Revised: 07/01/2016 Document Reviewed: 08/13/2015 Elsevier Interactive Patient Education  2017 [place] Prevention in the Home Falls can cause injuries. They can happen to people of all ages. There are many things you can do to make your home safe and to help prevent falls. What can I do on the outside of my home? Regularly fix the edges of walkways and driveways and fix any cracks. Remove anything that might make you trip as you walk through a door, such as a raised step or threshold. Trim any bushes or trees on the path to your home. Use bright outdoor lighting. Clear any walking paths of anything that might make someone trip, such as rocks or tools. Regularly check to see if handrails are loose or broken. Make sure that both sides of any steps have handrails. Any raised decks and porches should have guardrails on the edges. Have any leaves, snow, or ice cleared regularly. Use sand or salt on walking paths during winter. Clean up any spills in your garage right away. This includes oil or grease spills. What can I do in the bathroom? Use night lights. Install grab bars by the toilet and in the tub and shower. Do not use towel bars as grab bars. Use non-skid mats or decals in the tub or shower. If you need to sit down in  the shower, use a plastic, non-slip stool. Keep the floor dry. Clean up any water that spills on the floor as soon as it happens. Remove soap buildup in the tub or shower regularly. Attach bath mats securely with double-sided non-slip rug tape. Do not have throw rugs and other things on the floor that can make you trip. What can I do in the bedroom? Use night lights. Make sure that you have a light by your bed that is easy to reach. Do not use any sheets or blankets that are too big for your bed. They should not hang down onto the floor. Have a firm chair that has side arms. You can use this for support while you get dressed. Do not have throw rugs and other things on the floor that can make you trip. What can I do in the kitchen? Clean up any spills right away. Avoid walking on wet floors. Keep items that you use a lot in easy-to-reach places. If you need to reach something above you, use a strong step stool that has a grab bar. Keep electrical cords out of the way. Do not use floor polish or wax that makes floors slippery. If you must use wax, use non-skid floor wax. Do not have throw rugs and other things on the floor that can make you trip. What can I do with my stairs? Do not leave any items  on the stairs. Make sure that there are handrails on both sides of the stairs and use them. Fix handrails that are broken or loose. Make sure that handrails are as long as the stairways. Check any carpeting to make sure that it is firmly attached to the stairs. Fix any carpet that is loose or worn. Avoid having throw rugs at the top or bottom of the stairs. If you do have throw rugs, attach them to the floor with carpet tape. Make sure that you have a light switch at the top of the stairs and the bottom of the stairs. If you do not have them, ask someone to add them for you. What else can I do to help prevent falls? Wear shoes that: Do not have high heels. Have rubber bottoms. Are comfortable  and fit you well. Are closed at the toe. Do not wear sandals. If you use a stepladder: Make sure that it is fully opened. Do not climb a closed stepladder. Make sure that both sides of the stepladder are locked into place. Ask someone to hold it for you, if possible. Clearly mark and make sure that you can see: Any grab bars or handrails. First and last steps. Where the edge of each step is. Use tools that help you move around (mobility aids) if they are needed. These include: Canes. Walkers. Scooters. Crutches. Turn on the lights when you go into a dark area. Replace any light bulbs as soon as they burn out. Set up your furniture so you have a clear path. Avoid moving your furniture around. If any of your floors are uneven, fix them. If there are any pets around you, be aware of where they are. Review your medicines with your doctor. Some medicines can make you feel dizzy. This can increase your chance of falling. Ask your doctor what other things that you can do to help prevent falls. This information is not intended to replace advice given to you by your health care provider. Make sure you discuss any questions you have with your health care provider. Document Released: 08/08/2009 Document Revised: 03/19/2016 Document Reviewed: 11/16/2014 Elsevier Interactive Patient Education  2017 [employer].

## 2021-05-30 ENCOUNTER — Encounter: Payer: Self-pay | Admitting: Family Medicine

## 2021-05-30 ENCOUNTER — Ambulatory Visit (INDEPENDENT_AMBULATORY_CARE_PROVIDER_SITE_OTHER): Payer: Medicare PPO | Admitting: Family Medicine

## 2021-05-30 ENCOUNTER — Other Ambulatory Visit: Payer: Self-pay

## 2021-05-30 VITALS — BP 111/75 | HR 72 | Temp 97.7°F | Ht 62.0 in | Wt 118.2 lb

## 2021-05-30 DIAGNOSIS — Z0000 Encounter for general adult medical examination without abnormal findings: Secondary | ICD-10-CM

## 2021-05-30 DIAGNOSIS — Z0001 Encounter for general adult medical examination with abnormal findings: Secondary | ICD-10-CM | POA: Diagnosis not present

## 2021-05-30 DIAGNOSIS — R6889 Other general symptoms and signs: Secondary | ICD-10-CM

## 2021-05-30 DIAGNOSIS — Z6379 Other stressful life events affecting family and household: Secondary | ICD-10-CM

## 2021-05-30 DIAGNOSIS — H40003 Preglaucoma, unspecified, bilateral: Secondary | ICD-10-CM | POA: Diagnosis not present

## 2021-05-30 DIAGNOSIS — Z23 Encounter for immunization: Secondary | ICD-10-CM

## 2021-05-30 DIAGNOSIS — M810 Age-related osteoporosis without current pathological fracture: Secondary | ICD-10-CM

## 2021-05-30 MED ORDER — IBANDRONATE SODIUM 150 MG PO TABS: 150.0000 mg | ORAL_TABLET | ORAL | 4 refills | Status: DC

## 2021-05-30 NOTE — Patient Instructions (Signed)
Preventive Care 68 Years and Older, Female Preventive care refers to lifestyle choices and visits with your health care provider that can promote health and wellness. This includes: A yearly physical exam. This is also called an annual wellness visit. Regular dental and eye exams. Immunizations. Screening for certain conditions. Healthy lifestyle choices, such as: Eating a healthy diet. Getting regular exercise. Not using drugs or products that contain nicotine and tobacco. Limiting alcohol use. What can I expect for my preventive care visit? Physical exam Your health care provider will check your: Height and weight. These may be used to calculate your BMI (body mass index). BMI is a measurement that tells if you are at a healthy weight. Heart rate and blood pressure. Body temperature. Skin for abnormal spots. Counseling Your health care provider may ask you questions about your: Past medical problems. Family's medical history. Alcohol, tobacco, and drug use. Emotional well-being. Home life and relationship well-being. Sexual activity. Diet, exercise, and sleep habits. History of falls. Memory and ability to understand (cognition). Work and work Statistician. Pregnancy and menstrual history. Access to firearms. What immunizations do I need?  Vaccines are usually given at various ages, according to a schedule. Your health care provider will recommend vaccines for you based on your age, medicalhistory, and lifestyle or other factors, such as travel or where you work. What tests do I need? Blood tests Lipid and cholesterol levels. These may be checked every 5 years, or more often depending on your overall health. Hepatitis C test. Hepatitis B test. Screening Lung cancer screening. You may have this screening every year starting at age 44 if you have a 30-pack-year history of smoking and currently smoke or have quit within the past 15 years. Colorectal cancer screening. All  adults should have this screening starting at age 39 and continuing until age 65. Your health care provider may recommend screening at age 61 if you are at increased risk. You will have tests every 1-10 years, depending on your results and the type of screening test. Diabetes screening. This is done by checking your blood sugar (glucose) after you have not eaten for a while (fasting). You may have this done every 1-3 years. Mammogram. This may be done every 1-2 years. Talk with your health care provider about how often you should have regular mammograms. Abdominal aortic aneurysm (AAA) screening. You may need this if you are a current or former smoker. BRCA-related cancer screening. This may be done if you have a family history of breast, ovarian, tubal, or peritoneal cancers. Other tests STD (sexually transmitted disease) testing, if you are at risk. Bone density scan. This is done to screen for osteoporosis. You may have this done starting at age 54. Talk with your health care provider about your test results, treatment options,and if necessary, the need for more tests. Follow these instructions at home: Eating and drinking  Eat a diet that includes fresh fruits and vegetables, whole grains, lean protein, and low-fat dairy products. Limit your intake of foods with high amounts of sugar, saturated fats, and salt. Take vitamin and mineral supplements as recommended by your health care provider. Do not drink alcohol if your health care provider tells you not to drink. If you drink alcohol: Limit how much you have to 0-1 drink a day. Be aware of how much alcohol is in your drink. In the [place] one drink equals one 12 oz bottle of beer (355 mL), one 5 oz glass of wine (148 mL), or one 1  oz glass of hard liquor (44 mL).  Lifestyle Take daily care of your teeth and gums. Brush your teeth every morning and night with fluoride toothpaste. Floss one time each day. Stay active. Exercise for at  least 30 minutes 5 or more days each week. Do not use any products that contain nicotine or tobacco, such as cigarettes, e-cigarettes, and chewing tobacco. If you need help quitting, ask your health care provider. Do not use drugs. If you are sexually active, practice safe sex. Use a condom or other form of protection in order to prevent STIs (sexually transmitted infections). Talk with your health care provider about taking a low-dose aspirin or statin. Find healthy ways to cope with stress, such as: Meditation, yoga, or listening to music. Journaling. Talking to a trusted person. Spending time with friends and family. Safety Always wear your seat belt while driving or riding in a vehicle. Do not drive: If you have been drinking alcohol. Do not ride with someone who has been drinking. When you are tired or distracted. While texting. Wear a helmet and other protective equipment during sports activities. If you have firearms in your house, make sure you follow all gun safety procedures. What's next? Visit your health care provider once a year for an annual wellness visit. Ask your health care provider how often you should have your eyes and teeth checked. Stay up to date on all vaccines. This information is not intended to replace advice given to you by your health care provider. Make sure you discuss any questions you have with your healthcare provider. Document Revised: 10/02/2020 Document Reviewed: 10/06/2018 Elsevier Patient Education  2022 [employer].

## 2021-05-30 NOTE — Progress Notes (Signed)
Joanne Sullivan is a 70 y.o. female presents to office today for annual physical exam examination.    Concerns today include: 1. none Her mother was recently admitted to hospice care last week.  She had a recurrent stroke earlier this year and was diagnosed with colon cancer in February as well.  She is seeing her mother every day now because she is not sure when her last day will be.  She notes that she is got a recurrent GI bleed as well which worries Ms Lindsey.  She just does not want her mother to suffer.  Occupation: retired  Diet: Admits that she could have more vitamin D and calcium in her diet.  She takes supplements for this, Exercise: Pickleball and does 2 classes per week at the rec center Last [facility] exam[facility] [place] [facility] U[place] Last colonoscopy: UTD Last mammogram: UTD Last pap smear: UTD Refills needed today: boniva Immunizations needed: Immunization History  Administered Date(s) Administered   Influenza,inj,Quad PF,6+ Mos 08/04/2013, 10/30/2016   Moderna Sars-Covid-2 Vaccination 11/24/2019, 12/20/2019   Pneumococcal Conjugate-13 04/11/2019   Pneumococcal Polysaccharide-23 05/15/2020   Tdap 07/24/2011    Past Medical History:  Diagnosis Date   Allergy    Glaucoma suspect    Heart murmur    Seasonal allergic reaction    Social History   Socioeconomic History   Marital status: Married    Spouse name: KYvone Neu  Number of children: 1   Years of education: Not on file   Highest education level: Not on file  Occupational History   Occupation: retired  Tobacco Use   Smoking status: Never   Smokeless tobacco: Never  Vaping Use   Vaping Use: Never used  Substance and Sexual Activity   Alcohol use: No   Drug use: No   Sexual activity: Not on file  Other Topics Concern   Not on file  Social History Narrative   Lives home with husband. Son lives in [place]   Social Determinants of Health   Financial  Resource Strain: Low Risk    Difficulty of Paying Living Expenses: Not hard at all  Food Insecurity: No Food Insecurity   Worried About RCharity fundraiserin the Last Year: Never true   RGallantin the Last Year: Never true  Transportation Needs: No Transportation Needs   Lack of Transportation (Medical): No   Lack of Transportation (Non-Medical): No  Physical Activity: Sufficiently Active   Days of Exercise per Week: 6 days   Minutes of Exercise per Session: 60 min  Stress: No Stress Concern Present   Feeling of Stress : Only a little  Social Connections: SEngineer, building servicesof Communication with Friends and Family: More than three times a week   Frequency of Social Gatherings with Friends and Family: More than three times a week   Attends Religious Services: 1 to 4 times per year   Active Member of [employer] Organizations: Yes   Attends CMusic therapist More than 4 times per year   Marital Status: Married  IHuman resources officerViolence: Not At Risk   Fear of Current or Ex-Partner: No   Emotionally Abused: No   Physically Abused: No   Sexually Abused: No   Past Surgical History:  Procedure Laterality Date   left radial head fracture Left    Family History  Problem Relation Age of Onset   Heart disease Mother  2 MIs   Hypertension Mother    Heart disease Father 77       AMI   Cancer Sister        ovarian    Current Outpatient Medications:    Calcium Carbonate-Vit D-Min (CALTRATE 600+D PLUS MINERALS) 600-800 MG-UNIT TABS, Take 2 tablets by mouth daily., Disp: , Rfl:    ibandronate (BONIVA) 150 MG tablet, Take 1 tablet (150 mg total) by mouth every 30 (thirty) days. Take in the morning with a full glass of water, on an empty stomach, and do not take anything else by mouth or lie down for the next 30 min. (May need to run as brand for ins to cover), Disp: 3 tablet, Rfl: 4  No Known Allergies   ROS: Review of Systems Pertinent items  noted in HPI and remainder of comprehensive ROS otherwise negative.    Physical exam BP 111/75   Pulse 72   Temp 97.7 F (36.5 C)   Ht '5\' 2"'$  (1.575 m)   Wt 118 lb 3.2 oz (53.6 kg)   SpO2 100%   BMI 21.62 kg/m  General appearance: alert, cooperative, appears stated age, and no distress Head: Normocephalic, without obvious abnormality, atraumatic Eyes: negative findings: lids and lashes normal, conjunctivae and sclerae normal, corneas clear, and pupils equal, round, reactive to light and accomodation Ears: normal TM's and external ear canals both ears Nose: Nares normal. Septum midline. Mucosa normal. No drainage or sinus tenderness. Throat: lips, mucosa, and tongue normal; teeth and gums normal Neck: no adenopathy, no carotid bruit, supple, symmetrical, trachea midline, and thyroid not enlarged, symmetric, no tenderness/mass/nodules Back: symmetric, no curvature. ROM normal. No CVA tenderness. Lungs: clear to auscultation bilaterally Heart: regular rate and rhythm, S1, S2 normal, no murmur, click, rub or gallop Abdomen: soft, non-tender; bowel sounds normal; no masses,  no organomegaly Extremities: extremities normal, atraumatic, no cyanosis or edema Pulses: 2+ and symmetric Skin: Skin color, texture, turgor normal. No rashes or lesions Lymph nodes: Cervical, supraclavicular, and axillary nodes normal. Neurologic: Alert and oriented X 3, normal strength and tone. Normal symmetric reflexes. Normal coordination and gait Psych: Patient is very pleasant and interactive.  She becomes somewhat tearful when talking about her mother Depression screen [facility] 2/9 05/30/2021 05/29/2021 05/15/2020  Decreased Interest 0 0 0  Down, Depressed, Hopeless 0 0 0  PHQ - 2 Score 0 0 0  Altered sleeping - - 0  Tired, decreased energy - - 0  Change in appetite - - 0  Feeling bad or failure about yourself  - - 0  Trouble concentrating - - 0  Moving slowly or fidgety/restless - - 0  Suicidal thoughts - - 0   PHQ-9 Score - - 0   GAD 7 : Generalized Anxiety Score 05/30/2021  Nervous, Anxious, on Edge 0  Control/stop worrying 0  Worry too much - different things 0  Trouble relaxing 0  Restless 0  Easily annoyed or irritable 0  Afraid - awful might happen 0  Total GAD 7 Score 0  Anxiety Difficulty Not difficult at all   Assessment/ Plan: Stephani Police here for annual physical exam.   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  Plan for shingles vaccination despite never having had chickenpox per CDC guidelines.  This was confirmed with our clinical pharmacist.  Jaclyn Prime renewed.  Vitamin D was normal.  She understandably is going through a stressful time with the illness of her mother.  I am going to see her back in the next 3 to 4 months just for a check in since will be near the holidays and this typically is a difficult time for most patients.  I encouraged her to reach out to me at any time prior to that if needed  Continue to follow-up with Dr. Bethann Goo for her eyes  Counseled on healthy lifestyle choices, including diet (rich in fruits, vegetables and lean meats and low in salt and simple carbohydrates) and exercise (at least 30 minutes of moderate physical activity daily).  Lysha Schrade M. Lajuana Ripple, DO

## 2021-06-09 ENCOUNTER — Other Ambulatory Visit: Payer: Self-pay | Admitting: Family Medicine

## 2021-06-09 DIAGNOSIS — M810 Age-related osteoporosis without current pathological fracture: Secondary | ICD-10-CM

## 2021-06-25 ENCOUNTER — Encounter: Payer: Medicare PPO | Admitting: Family Medicine

## 2021-07-31 DIAGNOSIS — Z1231 Encounter for screening mammogram for malignant neoplasm of breast: Secondary | ICD-10-CM | POA: Diagnosis not present

## 2021-08-11 ENCOUNTER — Telehealth: Payer: Self-pay | Admitting: Family Medicine

## 2021-08-11 DIAGNOSIS — R928 Other abnormal and inconclusive findings on diagnostic imaging of breast: Secondary | ICD-10-CM

## 2021-08-11 NOTE — Telephone Encounter (Signed)
Amika called from [facility] stating that pt has an appt with them tomorrow at [facility] and they need [place] to fax them an order for pt.  Needs Bilateral Diag Mammogram with [place] if needed.  Fax# [phone]

## 2021-08-12 ENCOUNTER — Other Ambulatory Visit: Payer: Self-pay

## 2021-08-12 DIAGNOSIS — R928 Other abnormal and inconclusive findings on diagnostic imaging of breast: Secondary | ICD-10-CM | POA: Diagnosis not present

## 2021-08-12 DIAGNOSIS — N6489 Other specified disorders of breast: Secondary | ICD-10-CM | POA: Diagnosis not present

## 2021-08-12 DIAGNOSIS — R922 Inconclusive mammogram: Secondary | ICD-10-CM | POA: Diagnosis not present

## 2021-09-03 ENCOUNTER — Telehealth: Payer: Self-pay | Admitting: Family Medicine

## 2021-09-03 DIAGNOSIS — M810 Age-related osteoporosis without current pathological fracture: Secondary | ICD-10-CM

## 2021-09-05 MED ORDER — IBANDRONATE SODIUM 150 MG PO TABS: ORAL_TABLET | ORAL | 4 refills | Status: DC

## 2021-09-05 NOTE — Telephone Encounter (Signed)
3-4 month appt was for recheck mood.  She should have plenty of refills.  If she is feeling good, no appt needed.

## 2021-09-05 NOTE — Addendum Note (Signed)
Addended by: Everlean Cherry on: 09/05/2021 03:57 PM   Modules accepted: Orders

## 2021-10-28 ENCOUNTER — Telehealth: Payer: Self-pay | Admitting: Family Medicine

## 2021-10-28 ENCOUNTER — Other Ambulatory Visit: Payer: Self-pay

## 2021-10-28 DIAGNOSIS — N289 Disorder of kidney and ureter, unspecified: Secondary | ICD-10-CM

## 2021-10-28 NOTE — Telephone Encounter (Signed)
Spoke with pt. She would like her kidney function to be rechecked. Future orders placed.  Pt is coming in Feb for recheck of mood. Usually sees dr. Darnell Level annually in Aug.

## 2021-11-04 ENCOUNTER — Other Ambulatory Visit: Payer: Medicare PPO

## 2021-11-04 DIAGNOSIS — N289 Disorder of kidney and ureter, unspecified: Secondary | ICD-10-CM | POA: Diagnosis not present

## 2021-11-04 LAB — CMP14+EGFR
ALT: 14 IU/L (ref 0–32)
AST: 22 IU/L (ref 0–40)
Albumin/Globulin Ratio: 1.7 (ref 1.2–2.2)
Albumin: 4.3 g/dL (ref 3.8–4.8)
Alkaline Phosphatase: 89 IU/L (ref 44–121)
BUN/Creatinine Ratio: 17 (ref 12–28)
BUN: 17 mg/dL (ref 8–27)
Bilirubin Total: 0.6 mg/dL (ref 0.0–1.2)
CO2: 25 mmol/L (ref 20–29)
Calcium: 8.8 mg/dL (ref 8.7–10.3)
Chloride: 101 mmol/L (ref 96–106)
Creatinine, Ser: 1.03 mg/dL — ABNORMAL HIGH (ref 0.57–1.00)
Globulin, Total: 2.5 g/dL (ref 1.5–4.5)
Glucose: 91 mg/dL (ref 70–99)
Potassium: 3.7 mmol/L (ref 3.5–5.2)
Sodium: 139 mmol/L (ref 134–144)
Total Protein: 6.8 g/dL (ref 6.0–8.5)
eGFR: 59 mL/min/{1.73_m2} — ABNORMAL LOW (ref >59)

## 2021-11-05 ENCOUNTER — Telehealth: Payer: Self-pay | Admitting: Family Medicine

## 2021-11-05 NOTE — Telephone Encounter (Signed)
Pt called and labs were discussed

## 2021-11-05 NOTE — Telephone Encounter (Signed)
Pt called requesting to speak with nurse or provider regarding her lab results. Has questions.

## 2021-11-19 DIAGNOSIS — H40023 Open angle with borderline findings, high risk, bilateral: Secondary | ICD-10-CM | POA: Diagnosis not present

## 2021-11-19 DIAGNOSIS — H52223 Regular astigmatism, bilateral: Secondary | ICD-10-CM | POA: Diagnosis not present

## 2021-11-19 DIAGNOSIS — H5213 Myopia, bilateral: Secondary | ICD-10-CM | POA: Diagnosis not present

## 2021-11-19 DIAGNOSIS — H2513 Age-related nuclear cataract, bilateral: Secondary | ICD-10-CM | POA: Diagnosis not present

## 2021-11-19 DIAGNOSIS — H524 Presbyopia: Secondary | ICD-10-CM | POA: Diagnosis not present

## 2021-11-27 ENCOUNTER — Ambulatory Visit: Payer: Medicare PPO | Admitting: Family Medicine

## 2021-11-27 ENCOUNTER — Encounter: Payer: Self-pay | Admitting: Family Medicine

## 2021-11-27 VITALS — BP 123/80 | HR 82 | Temp 98.3°F | Ht 62.0 in | Wt 118.6 lb

## 2021-11-27 DIAGNOSIS — M810 Age-related osteoporosis without current pathological fracture: Secondary | ICD-10-CM

## 2021-11-27 DIAGNOSIS — N1831 Chronic kidney disease, stage 3a: Secondary | ICD-10-CM | POA: Insufficient documentation

## 2021-11-27 MED ORDER — RISEDRONATE SODIUM 150 MG PO TABS: 150.0000 mg | ORAL_TABLET | ORAL | 4 refills | Status: DC

## 2021-11-27 NOTE — Patient Instructions (Signed)
Chronic Kidney Disease, Adult Chronic kidney disease (CKD) occurs when the kidneys are slowly and permanently damaged over a long period of time. The kidneys are a pair of organs that do many important jobs in the body, including: Removing waste and extra fluid from the blood to make urine. Making hormones that maintain the amount of fluid in tissues and blood vessels. Maintaining the right amount of fluids and chemicals in the body. A small amount of kidney damage may not cause problems, but a large amount of damage may make it hard or impossible for the kidneys to work right. Steps must be taken to slow kidney damage or to stop it from getting worse. If steps are not taken, the kidneys may stop working permanently (end-stage renal disease, or ESRD). Most of the time, CKD does not go away, but it can often becontrolled. People who have CKD are usually able to live full lives. What are the causes? The most common causes of this condition are diabetes and high blood pressure (hypertension). Other causes include: Cardiovascular diseases. These affect the heart and blood vessels. Kidney diseases. These include: Glomerulonephritis, or inflammation of the tiny filters in the kidneys. Interstitial nephritis. This is swelling of the small tubes of the kidneys and of the surrounding structures. Polycystic kidney disease, in which clusters of fluid-filled sacs form within the kidneys. Renal vascular disease. This includes disorders that affect the arteries and veins of the kidneys. Diseases that affect the body's defense system (immune system). A problem with urine flow. This may be caused by: Kidney stones. Cancer. An enlarged prostate, in males. A kidney infection or urinary tract infection (UTI) that keeps coming back. Vasculitis. This is swelling or inflammation of the blood vessels. What increases the risk? Your chances of having kidney disease increase with age. The following factors may make  you more likely to develop this condition: A family history of kidney disease or kidney failure. Kidney failure means the kidneys can no longer work right. Certain genetic diseases. Taking medicines often that are damaging to the kidneys. Being around or being in contact with toxic substances. Obesity. A history of tobacco use. What are the signs or symptoms? Symptoms of this condition include: Feeling very tired (lethargic) and having less energy. Swelling, or edema, of the face, legs, ankles, or feet. Nausea or vomiting, or loss of appetite. Confusion or trouble concentrating. Muscle twitches and cramps, especially in the legs. Dry, itchy skin. A metallic taste in the mouth. Producing less urine, or producing more urine (especially at night). Shortness of breath. Trouble sleeping. CKD may also result in not having enough red blood cells or hemoglobin in the blood (anemia) or having weak bones (bone disease). Symptoms develop slowly and may not be obvious until the kidney damage becomessevere. It is possible to have kidney disease for years without having symptoms. How is this diagnosed? This condition may be diagnosed based on: Blood tests. Urine tests. Imaging tests, such as an ultrasound or a CT scan. A kidney biopsy. This involves removing a sample of kidney tissue to be looked at under a microscope. Results from these tests will help to determine how serious the CKD is. How is this treated? There is no cure for most cases of this condition, but treatment usually relieves symptoms and prevents or slows the worsening of the disease. Treatment may include: Diet changes, which may require you to avoid alcohol and foods that are high in salt, potassium, phosphorous, and protein. Medicines. These may: Lower blood   pressure. Control blood sugar (glucose). Relieve anemia. Relieve swelling. Protect your bones. Improve the balance of salts and minerals in your blood  (electrolytes). Dialysis, which is a type of treatment that removes toxic waste from the body. It may be needed if you have kidney failure. Managing any other conditions that are causing your CKD or making it worse. Follow these instructions at home: Medicines Take over-the-counter and prescription medicines only as told by your health care provider. The amount of some medicines that you take may need to be changed. Do not take any new medicines unless approved by your health care provider. Many medicines can make kidney damage worse. Do not take any vitamin and mineral supplements unless approved by your health care provider. Many nutritional supplements can make kidney damage worse. Lifestyle  Do not use any products that contain nicotine or tobacco, such as cigarettes, e-cigarettes, and chewing tobacco. If you need help quitting, ask your health care provider. If you drink alcohol: Limit how much you use to: 0-1 drink a day for women who are not pregnant. 0-2 drinks a day for men. Know how much alcohol is in your drink. In the [place] one drink equals one 12 oz bottle of beer (355 mL), one 5 oz glass of wine (148 mL), or one 1 oz glass of hard liquor (44 mL). Maintain a healthy weight. If you need help, ask your health care provider.  General instructions  Follow instructions from your health care provider about eating or drinking restrictions, including any prescribed diet. Track your blood pressure at home. Report changes in your blood pressure as told. If you are being treated for diabetes, track your blood glucose levels as told. Start or continue an exercise plan. Exercise at least 30 minutes a day, 5 days a week. Keep your immunizations up to date as told. Keep all follow-up visits. This is important.  Where to find more information American Association of Kidney Patients: www.aakp.org National Kidney Foundation: www.kidney.org American Kidney Fund: www.akfinc.org Life Options:  www.lifeoptions.org Kidney School: www.kidneyschool.org Contact a health care provider if: Your symptoms get worse. You develop new symptoms. Get help right away if: You develop symptoms of ESRD. These include: Headaches. Numbness in your hands or feet. Easy bruising. Frequent hiccups. Chest pain. Shortness of breath. Lack of menstrual periods, in women. You have a fever. You are producing less urine than usual. You have pain or bleeding when you urinate or when you have a bowel movement. These symptoms may represent a serious problem that is an emergency. Do not wait to see if the symptoms will go away. Get medical help right away. Call your local emergency services (911 in the [place] Do not drive yourself to the hospital. Summary Chronic kidney disease (CKD) occurs when the kidneys become damaged slowly over a long period of time. The most common causes of this condition are diabetes and high blood pressure (hypertension). There is no cure for most cases of CKD, but treatment usually relieves symptoms and prevents or slows the worsening of the disease. Treatment may include a combination of lifestyle changes, medicines, and dialysis. This information is not intended to replace advice given to you by your health care provider. Make sure you discuss any questions you have with your healthcare provider. Document Revised: 01/17/2020 Document Reviewed: 01/17/2020 Elsevier Patient Education  2022 [employer] Inc[employer]  

## 2021-11-27 NOTE — Progress Notes (Signed)
Subjective: CC: Chronic kidney disease stage IIIa PCP: Janora Norlander, DO BDZ:[id number] Higby is a 71 y.o. female presenting to clinic today for:  1.  Chronic kidney disease stage IIIa Patient here to further discuss chronic kidney disease stage IIIa.  She is had waxing and waning of her renal disease for the last couple of years between stage II and stage IIIa.  She has no history of hypertension, diabetes.  Does not use NSAIDs with any frequency, less than 3 times per year.  No use of PPIs or any supplements other than her calcium/vitamin D that she takes for her bones.  She feels like she hydrates really well but admits that she sweats a lot when she plays pickle ball.  She is very physically active.  She does not consume sodas.  No history of recurrent urinary tract infections.  She maintains a balanced diet.  She reports that her maternal uncle had some type of renal disease that did ultimately to his death.  Mother may have also had renal disease but she also had hypertension.  No known autoimmune disease in the family.  Patient has never been an IV drug abuser and has no known contact with HIV.  She was seen by Dr. Joelyn Oms years ago but would like a more extensive evaluation  2.  Osteoporosis Currently treated with Boniva once monthly.  Will be due for DEXA scan this fall.  No reports of bony pain, intolerance of the medication.   ROS: Per HPI  No Known Allergies Past Medical History:  Diagnosis Date   Allergy    Glaucoma suspect    Heart murmur    Seasonal allergic reaction     Current Outpatient Medications:    Biotin 10 MG TABS, Take by mouth., Disp: , Rfl:    Calcium Carbonate-Vit D-Min (CALTRATE 600+D PLUS MINERALS) 600-800 MG-UNIT TABS, Take 2 tablets by mouth daily., Disp: , Rfl:    Cholecalciferol (D3-1000 PO), Take by mouth., Disp: , Rfl:    ibandronate (BONIVA) 150 MG tablet, TAKE 1 TABLET MONTHLY AS DIRECTED. SEE PACKAGE FOR ADDITIONAL INSTRUCTIONS., Disp: 3  tablet, Rfl: 4 Social History   Socioeconomic History   Marital status: Married    Spouse name: Yvone Neu   Number of children: 1   Years of education: Not on file   Highest education level: Not on file  Occupational History   Occupation: retired  Tobacco Use   Smoking status: Never   Smokeless tobacco: Never  Vaping Use   Vaping Use: Never used  Substance and Sexual Activity   Alcohol use: No   Drug use: No   Sexual activity: Not on file  Other Topics Concern   Not on file  Social History Narrative   Lives home with husband. Son lives in [place].   Social Determinants of Health   Financial Resource Strain: Low Risk    Difficulty of Paying Living Expenses: Not hard at all  Food Insecurity: No Food Insecurity   Worried About Charity fundraiser in the Last Year: Never true   [place] in the Last Year: Never true  Transportation Needs: No Transportation Needs   Lack of Transportation (Medical): No   Lack of Transportation (Non-Medical): No  Physical Activity: Sufficiently Active   Days of Exercise per Week: 6 days   Minutes of Exercise per Session: 60 min  Stress: No Stress Concern Present   Feeling of Stress : Only a little  Social Connections: Socially Integrated  Frequency of Communication with Friends and Family: More than three times a week   Frequency of Social Gatherings with Friends and Family: More than three times a week   Attends Religious Services: 1 to 4 times per year   Active Member of [employer] or Organizations: Yes   Attends Music therapist: More than 4 times per year   Marital Status: Married  Human resources officer Violence: Not At Risk   Fear of Current or Ex-Partner: No   Emotionally Abused: No   Physically Abused: No   Sexually Abused: No   Family History  Problem Relation Age of Onset   Heart disease Mother        2 MIs   Hypertension Mother    Heart disease Father 38       AMI   Cancer Sister        ovarian     Objective: Office vital signs reviewed. BP 123/80    Pulse 82    Temp 98.3 F (36.8 C)    Ht 5\' 2"  (1.575 m)    Wt 118 lb 9.6 oz (53.8 kg)    SpO2 100%    BMI 21.69 kg/m   Physical Examination:  General: Awake, alert, well nourished, No acute distress HEENT: Sclera white.  Moist mucous membranes Cardio: regular rate and rhythm  Pulm: Normal work of breathing on room air Extremities: warm, well perfused, No edema, cyanosis or clubbing; +2 pulses bilaterally    Assessment/ Plan: 71 y.o. female   Chronic kidney disease, stage 3a ([place]) - Plan: Microalbumin / creatinine urine ratio, Ambulatory referral to Nephrology  Age-related osteoporosis without current pathological fracture - Plan: risedronate (ACTONEL) 150 MG tablet  We discussed various potential etiologies of CKD.  I will collect a urine microalbumin and I have referred her to nephrology for more extensive evaluation.  Continue adequate hydration, blood pressure control, frequent exercise and balanced diet  I am switching her from Boniva to Actonel since Boniva has been shown to be less efficacious in helping osteoporosis.  We discussed that insurance may not cover the Actonel in which point we will transition over to Fosamax instead.  She was agreeable.  GFR is totally appropriate to continue bisphosphonates orally but we discussed that if for any reason GFR starts declining we will need to consider alternatives.  She was in agreement.  No orders of the defined types were placed in this encounter.  No orders of the defined types were placed in this encounter.    Janora Norlander, DO [place] [phone]

## 2021-11-28 LAB — MICROALBUMIN / CREATININE URINE RATIO
Creatinine, Urine: 68.3 mg/dL
Microalb/Creat Ratio: 9 mg/g creat (ref 0–29)
Microalbumin, Urine: 6.4 ug/mL

## 2022-01-07 DIAGNOSIS — I9589 Other hypotension: Secondary | ICD-10-CM | POA: Diagnosis not present

## 2022-01-07 DIAGNOSIS — N1831 Chronic kidney disease, stage 3a: Secondary | ICD-10-CM | POA: Diagnosis not present

## 2022-01-07 DIAGNOSIS — Z79899 Other long term (current) drug therapy: Secondary | ICD-10-CM | POA: Diagnosis not present

## 2022-01-07 DIAGNOSIS — M810 Age-related osteoporosis without current pathological fracture: Secondary | ICD-10-CM | POA: Diagnosis not present

## 2022-01-07 DIAGNOSIS — R809 Proteinuria, unspecified: Secondary | ICD-10-CM | POA: Diagnosis not present

## 2022-01-08 ENCOUNTER — Other Ambulatory Visit (HOSPITAL_COMMUNITY): Payer: Self-pay | Admitting: Nephrology

## 2022-01-19 ENCOUNTER — Other Ambulatory Visit: Payer: Medicare PPO

## 2022-01-19 DIAGNOSIS — N1831 Chronic kidney disease, stage 3a: Secondary | ICD-10-CM | POA: Diagnosis not present

## 2022-01-19 DIAGNOSIS — I9589 Other hypotension: Secondary | ICD-10-CM | POA: Diagnosis not present

## 2022-01-19 DIAGNOSIS — Z79899 Other long term (current) drug therapy: Secondary | ICD-10-CM | POA: Diagnosis not present

## 2022-01-19 DIAGNOSIS — M810 Age-related osteoporosis without current pathological fracture: Secondary | ICD-10-CM | POA: Diagnosis not present

## 2022-01-20 ENCOUNTER — Other Ambulatory Visit: Payer: Self-pay

## 2022-01-20 ENCOUNTER — Ambulatory Visit (HOSPITAL_COMMUNITY)
Admission: RE | Admit: 2022-01-20 | Discharge: 2022-01-20 | Disposition: A | Discharge status: Home / Self Care | Payer: Medicare PPO | Source: Ambulatory Visit | Attending: Nephrology | Admitting: Nephrology

## 2022-01-20 DIAGNOSIS — N1831 Chronic kidney disease, stage 3a: Secondary | ICD-10-CM | POA: Diagnosis not present

## 2022-01-20 DIAGNOSIS — N200 Calculus of kidney: Secondary | ICD-10-CM | POA: Diagnosis not present

## 2022-02-02 ENCOUNTER — Telehealth: Payer: Self-pay | Admitting: Family Medicine

## 2022-02-03 ENCOUNTER — Telehealth: Payer: Self-pay | Admitting: *Deleted

## 2022-02-03 DIAGNOSIS — M810 Age-related osteoporosis without current pathological fracture: Secondary | ICD-10-CM

## 2022-02-03 NOTE — Telephone Encounter (Signed)
Drug ?Risedronate Sodium '150MG'$  tablets ?PA started  ? Key: [id number] ? ? ?Available without authorization. ?

## 2022-02-05 DIAGNOSIS — N200 Calculus of kidney: Secondary | ICD-10-CM | POA: Diagnosis not present

## 2022-02-05 DIAGNOSIS — E870 Hyperosmolality and hypernatremia: Secondary | ICD-10-CM | POA: Diagnosis not present

## 2022-02-05 DIAGNOSIS — I9589 Other hypotension: Secondary | ICD-10-CM | POA: Diagnosis not present

## 2022-02-05 DIAGNOSIS — N1831 Chronic kidney disease, stage 3a: Secondary | ICD-10-CM | POA: Diagnosis not present

## 2022-02-05 DIAGNOSIS — T452X1A Poisoning by vitamins, accidental (unintentional), initial encounter: Secondary | ICD-10-CM | POA: Diagnosis not present

## 2022-02-09 ENCOUNTER — Other Ambulatory Visit: Payer: Self-pay | Admitting: *Deleted

## 2022-02-09 DIAGNOSIS — M810 Age-related osteoporosis without current pathological fracture: Secondary | ICD-10-CM

## 2022-02-09 MED ORDER — RISEDRONATE SODIUM 150 MG PO TABS: 150.0000 mg | ORAL_TABLET | ORAL | 3 refills | Status: DC

## 2022-03-19 ENCOUNTER — Telehealth: Payer: Self-pay | Admitting: Family Medicine

## 2022-03-19 NOTE — Telephone Encounter (Signed)
Pt aware.

## 2022-03-19 NOTE — Telephone Encounter (Signed)
Nope.  She just had cholesterol done a few months ago and Dr Theador Hawthorne just ran a ton of labs on her in march.  Unless he wants me to collect something, she should be good for now.

## 2022-03-27 ENCOUNTER — Ambulatory Visit: Payer: Medicare PPO | Admitting: Family Medicine

## 2022-05-06 ENCOUNTER — Other Ambulatory Visit: Payer: Medicare PPO

## 2022-05-06 DIAGNOSIS — E870 Hyperosmolality and hypernatremia: Secondary | ICD-10-CM | POA: Diagnosis not present

## 2022-05-06 DIAGNOSIS — N1831 Chronic kidney disease, stage 3a: Secondary | ICD-10-CM | POA: Diagnosis not present

## 2022-05-06 DIAGNOSIS — T452X1A Poisoning by vitamins, accidental (unintentional), initial encounter: Secondary | ICD-10-CM | POA: Diagnosis not present

## 2022-05-06 DIAGNOSIS — N200 Calculus of kidney: Secondary | ICD-10-CM | POA: Diagnosis not present

## 2022-05-06 DIAGNOSIS — I9589 Other hypotension: Secondary | ICD-10-CM | POA: Diagnosis not present

## 2022-05-13 DIAGNOSIS — N1831 Chronic kidney disease, stage 3a: Secondary | ICD-10-CM | POA: Diagnosis not present

## 2022-05-13 DIAGNOSIS — N200 Calculus of kidney: Secondary | ICD-10-CM | POA: Diagnosis not present

## 2022-05-13 DIAGNOSIS — E870 Hyperosmolality and hypernatremia: Secondary | ICD-10-CM | POA: Diagnosis not present

## 2022-05-13 DIAGNOSIS — I9589 Other hypotension: Secondary | ICD-10-CM | POA: Diagnosis not present

## 2022-05-13 DIAGNOSIS — T452X1D Poisoning by vitamins, accidental (unintentional), subsequent encounter: Secondary | ICD-10-CM | POA: Diagnosis not present

## 2022-06-01 ENCOUNTER — Ambulatory Visit (INDEPENDENT_AMBULATORY_CARE_PROVIDER_SITE_OTHER): Payer: Medicare PPO

## 2022-06-01 VITALS — Wt 118.0 lb

## 2022-06-01 DIAGNOSIS — Z0000 Encounter for general adult medical examination without abnormal findings: Secondary | ICD-10-CM

## 2022-06-01 NOTE — Progress Notes (Signed)
Subjective:   Joanne Sullivan is a 71 y.o. female who presents for Medicare Annual (Subsequent) preventive examination.  Virtual Visit via Telephone Note  I connected with  Adalene Gulotta on 06/01/22 at  3:30 PM EDT by telephone and verified that I am speaking with the correct person using two identifiers.  Location: Patient: Home Provider: [facility] Persons participating in the virtual visit: patient/Nurse Health Advisor   I discussed the limitations, risks, security and privacy concerns of performing an evaluation and management service by telephone and the availability of in person appointments. The patient expressed understanding and agreed to proceed.  Interactive audio and video telecommunications were attempted between this nurse and patient, however failed, due to patient having technical difficulties OR patient did not have access to video capability.  We continued and completed visit with audio only.  Some vital signs may be absent or patient reported.   Masyn Fullam E Sanjith Siwek, LPN   Review of Systems     Cardiac Risk Factors include: advanced age (>33mn, >>109women)     Objective:    Today's Vitals   06/01/22 1537  Weight: 118 lb (53.5 kg)   Body mass index is 21.58 kg/m.     06/01/2022    3:46 PM 05/29/2021    4:27 PM  Advanced Directives  Does Patient Have a Medical Advance Directive? Yes No  Type of AParamedicof ASpencerLiving will   Copy of HMotleyin Chart? No - copy requested   Would patient like information on creating a medical advance directive?  Yes (MAU/Ambulatory/Procedural Areas - Information given)    Current Medications (verified) Outpatient Encounter Medications as of 06/01/2022  Medication Sig   Biotin 10 MG TABS Take by mouth.   Calcium Carbonate-Vit D-Min (CALTRATE 600+D PLUS MINERALS) 600-800 MG-UNIT TABS Take 2 tablets by mouth daily.   risedronate (ACTONEL) 150 MG tablet Take 1 tablet (150 mg  total) by mouth every 30 (thirty) days. with water on empty stomach, nothing by mouth or lie down for next 30 minutes.   [DISCONTINUED] Cholecalciferol (D3-1000 PO) Take by mouth. (Patient not taking: Reported on 06/01/2022)   No facility-administered encounter medications on file as of 06/01/2022.    Allergies (verified) Patient has no known allergies.   History: Past Medical History:  Diagnosis Date   Allergy    Glaucoma suspect    Heart murmur    Seasonal allergic reaction    Past Surgical History:  Procedure Laterality Date   left radial head fracture Left    Family History  Problem Relation Age of Onset   Heart disease Mother        2 MIs   Hypertension Mother    Heart disease Father 666      AMI   Cancer Sister        ovarian   Social History   Socioeconomic History   Marital status: Married    Spouse name: KYvone Neu  Number of children: 1   Years of education: Not on file   Highest education level: Not on file  Occupational History   Occupation: retired  Tobacco Use   Smoking status: Never   Smokeless tobacco: Never  Vaping Use   Vaping Use: Never used  Substance and Sexual Activity   Alcohol use: No   Drug use: No   Sexual activity: Not on file  Other Topics Concern   Not on file  Social History Narrative   Lives home with  husband. Son lives in [place].   Very active - goes to the gym, plays pickle ball, goes line dancing   Active member at church and ladies group   Social Determinants of Health   Financial Resource Strain: Low Risk  (06/01/2022)   Overall Financial Resource Strain (CARDIA)    Difficulty of Paying Living Expenses: Not hard at all  Food Insecurity: No Food Insecurity (06/01/2022)   Hunger Vital Sign    Worried About Running Out of Food in the Last Year: Never true    [place] in the Last Year: Never true  Transportation Needs: No Transportation Needs (06/01/2022)   PRAPARE - Hydrologist (Medical): No     Lack of Transportation (Non-Medical): No  Physical Activity: Sufficiently Active (06/01/2022)   Exercise Vital Sign    Days of Exercise per Week: 6 days    Minutes of Exercise per Session: 60 min  Stress: No Stress Concern Present (06/01/2022)   [place]    Feeling of Stress : Only a little  Social Connections: Socially Integrated (06/01/2022)   Social Connection and Isolation Panel [NHANES]    Frequency of Communication with Friends and Family: More than three times a week    Frequency of Social Gatherings with Friends and Family: More than three times a week    Attends Religious Services: More than 4 times per year    Active Member of [employer] or Organizations: Yes    Attends Music therapist: More than 4 times per year    Marital Status: Married    Tobacco Counseling Counseling given: Not Answered   Clinical Intake:  Pre-visit preparation completed: Yes  Pain : No/denies pain     BMI - recorded: 21.58 Nutritional Status: BMI of 19-24  Normal Nutritional Risks: None Diabetes: No  How often do you need to have someone help you when you read instructions, pamphlets, or other written materials from your doctor or pharmacy?: 1 - Never  Diabetic? no  Interpreter Needed?: No  Information entered by :: Letrice Pollok, LPN   Activities of Daily Living    06/01/2022    3:41 PM  In your present state of health, do you have any difficulty performing the following activities:  Hearing? 0  Vision? 0  Difficulty concentrating or making decisions? 0  Walking or climbing stairs? 0  Dressing or bathing? 0  Doing errands, shopping? 0  Preparing Food and eating ? N  Using the Toilet? N  In the past six months, have you accidently leaked urine? N  Do you have problems with loss of bowel control? N  Managing your Medications? N  Managing your Finances? N  Housekeeping or managing your Housekeeping? N     Patient Care Team: Janora Norlander, DO as PCP - General (Family Medicine)  Indicate any recent Medical Services you may have received from other than [facility] providers in the past year (date may be approximate).     Assessment:   This is a routine wellness examination for Joanne Sullivan.  Hearing/Vision screen Hearing Screening - Comments:: Denies hearing difficulties   Vision Screening - Comments:: Wears contact lenses - up to date with routine eye exams with Dr Everlene Farrier at [facility] in [place] and exercise activities discussed: Current Exercise Habits: Home exercise routine;Structured exercise class, Type of exercise: walking;strength training/weights;stretching;calisthenics, Time (Minutes): 60, Frequency (Times/Week): 6, Weekly Exercise (Minutes/Week): 360, Intensity:  Moderate, Exercise limited by: None identified   Goals Addressed             This Visit's Progress    DIET - INCREASE WATER INTAKE   On track      Depression Screen    06/01/2022    3:40 PM 11/27/2021    3:50 PM 05/30/2021    8:07 AM 05/29/2021    4:21 PM 05/15/2020    3:09 PM 04/11/2019    2:05 PM 12/29/2017    3:39 PM  PHQ 2/9 Scores  PHQ - 2 Score 0 0 0 0 0 0 0  PHQ- 9 Score     0 0     Fall Risk    06/01/2022    3:39 PM 11/27/2021    3:50 PM 05/30/2021    8:07 AM 05/29/2021    4:27 PM 05/15/2020    3:08 PM  Fall Risk   Falls in the past year? 0 1 0 0 1  Number falls in past yr: 0 1  0 0  Injury with Fall? 0 0  0 0  Risk for fall due to : Orthopedic patient History of fall(s)  Impaired vision History of fall(s)  Follow up Falls prevention discussed Education provided  Falls prevention discussed Falls prevention discussed    FALL RISK PREVENTION PERTAINING TO THE HOME:  Any stairs in or around the home? Yes  If so, are there any without handrails? No  Home free of loose throw rugs in walkways, pet beds, electrical cords, etc? Yes  Adequate lighting in your home to reduce risk of falls?  Yes   ASSISTIVE DEVICES UTILIZED TO PREVENT FALLS:  Life alert? No  Use of a cane, walker or w/c? No  Grab bars in the bathroom? Yes  Shower chair or bench in shower? Yes  Elevated toilet seat or a handicapped toilet? Yes   TIMED UP AND GO:  Was the test performed? No . Telephonic visit  Cognitive Function:        06/01/2022    3:41 PM 05/29/2021    4:26 PM  6CIT Screen  What Year? 0 points 0 points  What month? 0 points 0 points  What time? 0 points 0 points  Count back from 20 0 points 0 points  Months in reverse 0 points 0 points  Repeat phrase 0 points 0 points  Total Score 0 points 0 points    Immunizations Immunization History  Administered Date(s) Administered   Influenza,inj,Quad PF,6+ Mos 08/04/2013, 10/30/2016   Moderna Sars-Covid-2 Vaccination 11/24/2019, 12/20/2019   Pneumococcal Conjugate-13 04/11/2019   Pneumococcal Polysaccharide-23 05/15/2020   Td 05/30/2021   Tdap 07/24/2011    TDAP status: Up to date  Flu Vaccine status: Due, Education has been provided regarding the importance of this vaccine. Advised may receive this vaccine at local pharmacy or Health Dept. Aware to provide a copy of the vaccination record if obtained from local pharmacy or Health Dept. Verbalized acceptance and understanding.  Pneumococcal vaccine status: Up to date  Covid-19 vaccine status: Completed vaccines  Qualifies for Shingles Vaccine? Yes   Zostavax completed No   Shingrix Completed?: No.    Education has been provided regarding the importance of this vaccine. Patient has been advised to call insurance company to determine out of pocket expense if they have not yet received this vaccine. Advised may also receive vaccine at local pharmacy or Health Dept. Verbalized acceptance and understanding.  Screening Tests Health Maintenance  Topic Date Due  Zoster Vaccines- Shingrix (1 of 2) Never done   COVID-19 Vaccine (3 - Moderna risk series) 01/17/2020   INFLUENZA VACCINE   05/26/2022   DEXA SCAN  06/03/2022   MAMMOGRAM  08/13/2023   COLONOSCOPY (Pts 45-34yr Insurance coverage will need to be confirmed)  03/26/2027   TETANUS/TDAP  05/31/2031   Pneumonia Vaccine 71 Years old  Completed   Hepatitis C Screening  Completed   HPV VACCINES  Aged Out    Health Maintenance  Health Maintenance Due  Topic Date Due   Zoster Vaccines- Shingrix (1 of 2) Never done   COVID-19 Vaccine (3 - Moderna risk series) 01/17/2020   INFLUENZA VACCINE  05/26/2022    Colorectal cancer screening: Type of screening: Colonoscopy. Completed 03/25/2017. Repeat every 10 years  Mammogram status: Completed 08/12/2021. Repeat every year  Bone Density status: Completed 06/03/2020. Results reflect: Bone density results: OSTEOPOROSIS. Repeat every 2 years. She will get this at her visit in 2 weeks  Lung Cancer Screening: (Low Dose CT Chest recommended if Age 69-80years, 30 pack-year currently smoking OR have quit w/in 15years.) does not qualify  Additional Screening:  Hepatitis C Screening: does qualify; Completed 02/04/2018  Vision Screening: Recommended annual ophthalmology exams for early detection of glaucoma and other disorders of the eye. Is the patient up to date with their annual eye exam?  Yes  Who is the provider or what is the name of the office in which the patient attends annual eye exams[place] [facility] [place] not established with a provider, would they like to be referred to a provider to establish care? No .   Dental Screening: Recommended annual dental exams for proper oral hygiene  Community Resource Referral / Chronic Care Management: CRR required this visit?  No   CCM required this visit?  No      Plan:     I have personally reviewed and noted the following in the patient's chart:   Medical and social history Use of alcohol, tobacco or illicit drugs  Current medications and supplements including opioid prescriptions.  Functional ability and  status Nutritional status Physical activity Advanced directives List of other physicians Hospitalizations, surgeries, and ER visits in previous 12 months Vitals Screenings to include cognitive, depression, and falls Referrals and appointments  In addition, I have reviewed and discussed with patient certain preventive protocols, quality metrics, and best practice recommendations. A written personalized care plan for preventive services as well as general preventive health recommendations were provided to patient.     ASandrea Hammond LPN   [id number]  Nurse Notes: none

## 2022-06-01 NOTE — Patient Instructions (Signed)
Joanne Sullivan , Thank you for taking time to come for your Medicare Wellness Visit. I appreciate your ongoing commitment to your health goals. Please review the following plan we discussed and let me know if I can assist you in the future.   Screening recommendations/referrals: Colonoscopy: Done 03/25/2017 -  Repeat in 10 years Mammogram: Done 08/12/2021 - Repeat annually  Bone Density: Done 06/03/2020 - Repeat every 2 years *get at next visit (06/15/22 @ 3pm) Recommended yearly ophthalmology/optometry visit for glaucoma screening and checkup Recommended yearly dental visit for hygiene and checkup  Vaccinations: Influenza vaccine: Due every fall Pneumococcal vaccine: Done 04/11/2019 & 05/30/2021 Tdap vaccine: Done 05/30/2021 - Repeat in 10 years  Shingles vaccine: Due - Shingrix is 2 doses 2-6 months apart and over 90% effective     Covid-19: Done 11/24/2019 & 12/20/2019 - for additional boosters, contact pharmacy  Advanced directives: Please bring a copy of your health care power of attorney and living will to the office to be added to your chart at your convenience.   Conditions/risks identified: Keep up the great work!   Next appointment: Follow up in one year for your annual wellness visit    Preventive Care 65 Years and Older, Female Preventive care refers to lifestyle choices and visits with your health care provider that can promote health and wellness. What does preventive care include? A yearly physical exam. This is also called an annual well check. Dental exams once or twice a year. Routine eye exams. Ask your health care provider how often you should have your eyes checked. Personal lifestyle choices, including: Daily care of your teeth and gums. Regular physical activity. Eating a healthy diet. Avoiding tobacco and drug use. Limiting alcohol use. Practicing safe sex. Taking low-dose aspirin every day. Taking vitamin and mineral supplements as recommended by your health care  provider. What happens during an annual well check? The services and screenings done by your health care provider during your annual well check will depend on your age, overall health, lifestyle risk factors, and family history of disease. Counseling  Your health care provider may ask you questions about your: Alcohol use. Tobacco use. Drug use. Emotional well-being. Home and relationship well-being. Sexual activity. Eating habits. History of falls. Memory and ability to understand (cognition). Work and work Statistician. Reproductive health. Screening  You may have the following tests or measurements: Height, weight, and BMI. Blood pressure. Lipid and cholesterol levels. These may be checked every 5 years, or more frequently if you are over 16 years old. Skin check. Lung cancer screening. You may have this screening every year starting at age 22 if you have a 30-pack-year history of smoking and currently smoke or have quit within the past 15 years. Fecal occult blood test (FOBT) of the stool. You may have this test every year starting at age 6. Flexible sigmoidoscopy or colonoscopy. You may have a sigmoidoscopy every 5 years or a colonoscopy every 10 years starting at age 75. Hepatitis C blood test. Hepatitis B blood test. Sexually transmitted disease (STD) testing. Diabetes screening. This is done by checking your blood sugar (glucose) after you have not eaten for a while (fasting). You may have this done every 1-3 years. Bone density scan. This is done to screen for osteoporosis. You may have this done starting at age 54. Mammogram. This may be done every 1-2 years. Talk to your health care provider about how often you should have regular mammograms. Talk with your health care provider about your  test results, treatment options, and if necessary, the need for more tests. Vaccines  Your health care provider may recommend certain vaccines, such as: Influenza vaccine. This is  recommended every year. Tetanus, diphtheria, and acellular pertussis (Tdap, Td) vaccine. You may need a Td booster every 10 years. Zoster vaccine. You may need this after age 21. Pneumococcal 13-valent conjugate (PCV13) vaccine. One dose is recommended after age 74. Pneumococcal polysaccharide (PPSV23) vaccine. One dose is recommended after age 38. Talk to your health care provider about which screenings and vaccines you need and how often you need them. This information is not intended to replace advice given to you by your health care provider. Make sure you discuss any questions you have with your health care provider. Document Released: 11/08/2015 Document Revised: 07/01/2016 Document Reviewed: 08/13/2015 Elsevier Interactive Patient Education  2017 [place] Prevention in the Home Falls can cause injuries. They can happen to people of all ages. There are many things you can do to make your home safe and to help prevent falls. What can I do on the outside of my home? Regularly fix the edges of walkways and driveways and fix any cracks. Remove anything that might make you trip as you walk through a door, such as a raised step or threshold. Trim any bushes or trees on the path to your home. Use bright outdoor lighting. Clear any walking paths of anything that might make someone trip, such as rocks or tools. Regularly check to see if handrails are loose or broken. Make sure that both sides of any steps have handrails. Any raised decks and porches should have guardrails on the edges. Have any leaves, snow, or ice cleared regularly. Use sand or salt on walking paths during winter. Clean up any spills in your garage right away. This includes oil or grease spills. What can I do in the bathroom? Use night lights. Install grab bars by the toilet and in the tub and shower. Do not use towel bars as grab bars. Use non-skid mats or decals in the tub or shower. If you need to sit down in  the shower, use a plastic, non-slip stool. Keep the floor dry. Clean up any water that spills on the floor as soon as it happens. Remove soap buildup in the tub or shower regularly. Attach bath mats securely with double-sided non-slip rug tape. Do not have throw rugs and other things on the floor that can make you trip. What can I do in the bedroom? Use night lights. Make sure that you have a light by your bed that is easy to reach. Do not use any sheets or blankets that are too big for your bed. They should not hang down onto the floor. Have a firm chair that has side arms. You can use this for support while you get dressed. Do not have throw rugs and other things on the floor that can make you trip. What can I do in the kitchen? Clean up any spills right away. Avoid walking on wet floors. Keep items that you use a lot in easy-to-reach places. If you need to reach something above you, use a strong step stool that has a grab bar. Keep electrical cords out of the way. Do not use floor polish or wax that makes floors slippery. If you must use wax, use non-skid floor wax. Do not have throw rugs and other things on the floor that can make you trip. What can I do with my  stairs? Do not leave any items on the stairs. Make sure that there are handrails on both sides of the stairs and use them. Fix handrails that are broken or loose. Make sure that handrails are as long as the stairways. Check any carpeting to make sure that it is firmly attached to the stairs. Fix any carpet that is loose or worn. Avoid having throw rugs at the top or bottom of the stairs. If you do have throw rugs, attach them to the floor with carpet tape. Make sure that you have a light switch at the top of the stairs and the bottom of the stairs. If you do not have them, ask someone to add them for you. What else can I do to help prevent falls? Wear shoes that: Do not have high heels. Have rubber bottoms. Are comfortable  and fit you well. Are closed at the toe. Do not wear sandals. If you use a stepladder: Make sure that it is fully opened. Do not climb a closed stepladder. Make sure that both sides of the stepladder are locked into place. Ask someone to hold it for you, if possible. Clearly mark and make sure that you can see: Any grab bars or handrails. First and last steps. Where the edge of each step is. Use tools that help you move around (mobility aids) if they are needed. These include: Canes. Walkers. Scooters. Crutches. Turn on the lights when you go into a dark area. Replace any light bulbs as soon as they burn out. Set up your furniture so you have a clear path. Avoid moving your furniture around. If any of your floors are uneven, fix them. If there are any pets around you, be aware of where they are. Review your medicines with your doctor. Some medicines can make you feel dizzy. This can increase your chance of falling. Ask your doctor what other things that you can do to help prevent falls. This information is not intended to replace advice given to you by your health care provider. Make sure you discuss any questions you have with your health care provider. Document Released: 08/08/2009 Document Revised: 03/19/2016 Document Reviewed: 11/16/2014 Elsevier Interactive Patient Education  2017 [employer].

## 2022-06-01 NOTE — Progress Notes (Deleted)
Subjective:   Joanne Sullivan is a 71 y.o. female who presents for Medicare Annual (Subsequent) preventive examination.  Review of Systems    ***       Objective:    There were no vitals filed for this visit. There is no height or weight on file to calculate BMI.     05/29/2021    4:27 PM  Advanced Directives  Does Patient Have a Medical Advance Directive? No  Would patient like information on creating a medical advance directive? Yes (MAU/Ambulatory/Procedural Areas - Information given)    Current Medications (verified) Outpatient Encounter Medications as of 06/01/2022  Medication Sig   Biotin 10 MG TABS Take by mouth.   Calcium Carbonate-Vit D-Min (CALTRATE 600+D PLUS MINERALS) 600-800 MG-UNIT TABS Take 2 tablets by mouth daily.   Cholecalciferol (D3-1000 PO) Take by mouth.   risedronate (ACTONEL) 150 MG tablet Take 1 tablet (150 mg total) by mouth every 30 (thirty) days. with water on empty stomach, nothing by mouth or lie down for next 30 minutes.   No facility-administered encounter medications on file as of 06/01/2022.    Allergies (verified) Patient has no known allergies.   History: Past Medical History:  Diagnosis Date   Allergy    Glaucoma suspect    Heart murmur    Seasonal allergic reaction    Past Surgical History:  Procedure Laterality Date   left radial head fracture Left    Family History  Problem Relation Age of Onset   Heart disease Mother        2 MIs   Hypertension Mother    Heart disease Father 80       AMI   Cancer Sister        ovarian   Social History   Socioeconomic History   Marital status: Married    Spouse name: Yvone Neu   Number of children: 1   Years of education: Not on file   Highest education level: Not on file  Occupational History   Occupation: retired  Tobacco Use   Smoking status: Never   Smokeless tobacco: Never  Vaping Use   Vaping Use: Never used  Substance and Sexual Activity   Alcohol use: No   Drug use:  No   Sexual activity: Not on file  Other Topics Concern   Not on file  Social History Narrative   Lives home with husband. Son lives in [place].   Social Determinants of Health   Financial Resource Strain: Low Risk  (05/29/2021)   Overall Financial Resource Strain (CARDIA)    Difficulty of Paying Living Expenses: Not hard at all  Food Insecurity: No Food Insecurity (05/29/2021)   Hunger Vital Sign    Worried About Running Out of Food in the Last Year: Never true    [place] in the Last Year: Never true  Transportation Needs: No Transportation Needs (05/29/2021)   PRAPARE - Hydrologist (Medical): No    Lack of Transportation (Non-Medical): No  Physical Activity: Sufficiently Active (05/29/2021)   Exercise Vital Sign    Days of Exercise per Week: 6 days    Minutes of Exercise per Session: 60 min  Stress: No Stress Concern Present (05/29/2021)   [place]    Feeling of Stress : Only a little  Social Connections: Socially Integrated (05/29/2021)   Social Connection and Isolation Panel [NHANES]    Frequency of Communication with Friends and  Family: More than three times a week    Frequency of Social Gatherings with Friends and Family: More than three times a week    Attends Religious Services: 1 to 4 times per year    Active Member of [employer] or Organizations: Yes    Attends Music therapist: More than 4 times per year    Marital Status: Married    Tobacco Counseling Counseling given: Not Answered   Clinical Intake:                 Diabetic?***         Activities of Daily Living     No data to display          Patient Care Team: Janora Norlander, DO as PCP - General (Family Medicine)  Indicate any recent Medical Services you may have received from other than [facility] providers in the past year (date may be approximate).     Assessment:   This  is a routine wellness examination for [place].  Hearing/Vision screen No results found.  Dietary issues and exercise activities discussed:     Goals Addressed   None    Depression Screen    11/27/2021    3:50 PM 05/30/2021    8:07 AM 05/29/2021    4:21 PM 05/15/2020    3:09 PM 04/11/2019    2:05 PM 12/29/2017    3:39 PM 04/23/2017   11:00 AM  PHQ 2/9 Scores  PHQ - 2 Score 0 0 0 0 0 0 0  PHQ- 9 Score    0 0      Fall Risk    11/27/2021    3:50 PM 05/30/2021    8:07 AM 05/29/2021    4:27 PM 05/15/2020    3:08 PM 04/11/2019    2:04 PM  Fall Risk   Falls in the past year? 1 0 0 1 0  Number falls in past yr: 1  0 0   Injury with Fall? 0  0 0   Risk for fall due to : History of fall(s)  Impaired vision History of fall(s)   Follow up Education provided  Falls prevention discussed Falls prevention discussed     FALL RISK PREVENTION PERTAINING TO THE HOME:  Any stairs in or around the home? {YES/NO:21197} If so, are there any without handrails? {YES/NO:21197} Home free of loose throw rugs in walkways, pet beds, electrical cords, etc? {YES/NO:21197} Adequate lighting in your home to reduce risk of falls? {YES/NO:21197}  ASSISTIVE DEVICES UTILIZED TO PREVENT FALLS:  Life alert? {YES/NO:21197} Use of a cane, walker or w/c? {YES/NO:21197} Grab bars in the bathroom? {YES/NO:21197} Shower chair or bench in shower? {YES/NO:21197} Elevated toilet seat or a handicapped toilet? {YES/NO:21197}  TIMED UP AND GO:  Was the test performed? {YES/NO:21197}.  Length of time to ambulate 10 feet: *** sec.   {Appearance of NIDP:8242353}  Cognitive Function:        05/29/2021    4:26 PM  6CIT Screen  What Year? 0 points  What month? 0 points  What time? 0 points  Count back from 20 0 points  Months in reverse 0 points  Repeat phrase 0 points  Total Score 0 points    Immunizations Immunization History  Administered Date(s) Administered   Influenza,inj,Quad PF,6+ Mos 08/04/2013,  10/30/2016   Moderna Sars-Covid-2 Vaccination 11/24/2019, 12/20/2019   Pneumococcal Conjugate-13 04/11/2019   Pneumococcal Polysaccharide-23 05/15/2020   Td 05/30/2021   Tdap 07/24/2011    {TDAP  status:2101805}  {Flu Vaccine status:2101806}  {Pneumococcal vaccine status:2101807}  {Covid-19 vaccine status:2101808}  Qualifies for Shingles Vaccine? {YES/NO:21197}  Zostavax completed {YES/NO:21197}  {Shingrix Completed?:2101804}  Screening Tests Health Maintenance  Topic Date Due   Zoster Vaccines- Shingrix (1 of 2) Never done   COVID-19 Vaccine (3 - Moderna risk series) 01/17/2020   INFLUENZA VACCINE  05/26/2022   DEXA SCAN  06/03/2022   MAMMOGRAM  08/13/2023   COLONOSCOPY (Pts 45-56yr Insurance coverage will need to be confirmed)  03/26/2027   TETANUS/TDAP  05/31/2031   Pneumonia Vaccine 71 Years old  Completed   Hepatitis C Screening  Completed   HPV VACCINES  Aged Out    Health Maintenance  Health Maintenance Due  Topic Date Due   Zoster Vaccines- Shingrix (1 of 2) Never done   COVID-19 Vaccine (3 - Moderna risk series) 01/17/2020   INFLUENZA VACCINE  05/26/2022    {Colorectal cancer screening:2101809}  {Mammogram status:21018020}  {Bone Density status:21018021}  Lung Cancer Screening: (Low Dose CT Chest recommended if Age 19-80 years, 30 pack-year currently smoking OR have quit w/in 15years.) {DOES NOT does:27190::"does not"} qualify.   Lung Cancer Screening Referral: ***  Additional Screening:  Hepatitis C Screening: {DOES NOT does:27190::"does not"} qualify; Completed ***  Vision Screening: Recommended annual ophthalmology exams for early detection of glaucoma and other disorders of the eye. Is the patient up to date with their annual eye exam?  {YES/NO:21197} Who is the provider or what is the name of the office in which the patient attends annual eye exams? *** If pt is not established with a provider, would they like to be referred to a provider to  establish care? {YES/NO:21197}.   Dental Screening: Recommended annual dental exams for proper oral hygiene  Community Resource Referral / Chronic Care Management: CRR required this visit?  {YES/NO:21197}  CCM required this visit?  {YES/NO:21197}     Plan:     I have personally reviewed and noted the following in the patient's chart:   Medical and social history Use of alcohol, tobacco or illicit drugs  Current medications and supplements including opioid prescriptions.  Functional ability and status Nutritional status Physical activity Advanced directives List of other physicians Hospitalizations, surgeries, and ER visits in previous 12 months Vitals Screenings to include cognitive, depression, and falls Referrals and appointments  In addition, I have reviewed and discussed with patient certain preventive protocols, quality metrics, and best practice recommendations. A written personalized care plan for preventive services as well as general preventive health recommendations were provided to patient.     ASandrea Hammond LPN   81/12/863  Nurse Notes: ***

## 2022-06-15 ENCOUNTER — Ambulatory Visit: Payer: Medicare PPO | Admitting: Family Medicine

## 2022-06-15 ENCOUNTER — Encounter: Payer: Self-pay | Admitting: Family Medicine

## 2022-06-15 VITALS — BP 120/69 | HR 90 | Temp 97.4°F | Ht 62.0 in | Wt 118.8 lb

## 2022-06-15 DIAGNOSIS — Z23 Encounter for immunization: Secondary | ICD-10-CM

## 2022-06-15 DIAGNOSIS — M810 Age-related osteoporosis without current pathological fracture: Secondary | ICD-10-CM | POA: Diagnosis not present

## 2022-06-15 DIAGNOSIS — N1831 Chronic kidney disease, stage 3a: Secondary | ICD-10-CM | POA: Diagnosis not present

## 2022-06-15 MED ORDER — RISEDRONATE SODIUM 150 MG PO TABS: 150.0000 mg | ORAL_TABLET | ORAL | 3 refills | Status: DC

## 2022-06-15 NOTE — Progress Notes (Signed)
Subjective: CC: CKD PCP: Janora Norlander, DO AST:[id number] Kingsford is a 71 y.o. female presenting to clinic today for:  1.  CKD 3A Patient continues to follow-up closely with her nephrologist.  Most recent labs were drawn in July and showed GFR 58.  Things have been holding stable and she is really been trying to work towards as healthy of a lifestyle as possible.  She hydrates well, exercises regularly and tries to eat the right foods.  2.  Osteoporosis Patient is compliant with Actonel monthly.  Denies any bony pain.  No GERD symptoms reported.  Would like to proceed with DEXA scan.   ROS: Per HPI  No Known Allergies Past Medical History:  Diagnosis Date   Allergy    Glaucoma suspect    Heart murmur    Seasonal allergic reaction     Current Outpatient Medications:    Biotin 10 MG TABS, Take by mouth., Disp: , Rfl:    Calcium Carbonate-Vit D-Min (CALTRATE 600+D PLUS MINERALS) 600-800 MG-UNIT TABS, Take 2 tablets by mouth daily., Disp: , Rfl:    risedronate (ACTONEL) 150 MG tablet, Take 1 tablet (150 mg total) by mouth every 30 (thirty) days. with water on empty stomach, nothing by mouth or lie down for next 30 minutes., Disp: 3 tablet, Rfl: 3 Social History   Socioeconomic History   Marital status: Married    Spouse name: Yvone Neu   Number of children: 1   Years of education: Not on file   Highest education level: Not on file  Occupational History   Occupation: retired  Tobacco Use   Smoking status: Never   Smokeless tobacco: Never  Vaping Use   Vaping Use: Never used  Substance and Sexual Activity   Alcohol use: No   Drug use: No   Sexual activity: Not on file  Other Topics Concern   Not on file  Social History Narrative   Lives home with husband. Son lives in [place].   Very active - goes to the gym, plays pickle ball, goes line dancing   Active member at church and ladies group   Social Determinants of Health   Financial Resource Strain: Low Risk   (06/01/2022)   Overall Financial Resource Strain (CARDIA)    Difficulty of Paying Living Expenses: Not hard at all  Food Insecurity: No Food Insecurity (06/01/2022)   Hunger Vital Sign    Worried About Running Out of Food in the Last Year: Never true    [place] in the Last Year: Never true  Transportation Needs: No Transportation Needs (06/01/2022)   PRAPARE - Hydrologist (Medical): No    Lack of Transportation (Non-Medical): No  Physical Activity: Sufficiently Active (06/01/2022)   Exercise Vital Sign    Days of Exercise per Week: 6 days    Minutes of Exercise per Session: 60 min  Stress: No Stress Concern Present (06/01/2022)   [place]    Feeling of Stress : Only a little  Social Connections: Socially Integrated (06/01/2022)   Social Connection and Isolation Panel [NHANES]    Frequency of Communication with Friends and Family: More than three times a week    Frequency of Social Gatherings with Friends and Family: More than three times a week    Attends Religious Services: More than 4 times per year    Active Member of [employer] or Organizations: Yes    Attends Archivist  Meetings: More than 4 times per year    Marital Status: Married  Human resources officer Violence: Not At Risk (06/01/2022)   Humiliation, Afraid, Rape, and Kick questionnaire    Fear of Current or Ex-Partner: No    Emotionally Abused: No    Physically Abused: No    Sexually Abused: No   Family History  Problem Relation Age of Onset   Heart disease Mother        2 MIs   Hypertension Mother    Heart disease Father 84       AMI   Cancer Sister        ovarian    Objective: Office vital signs reviewed. BP 120/69   Pulse 90   Temp (!) 97.4 F (36.3 C)   Ht '5\' 2"'$  (1.575 m)   Wt 118 lb 12.8 oz (53.9 kg)   SpO2 98%   BMI 21.73 kg/m   Physical Examination:  General: Awake, alert, well nourished, No acute  distress HEENT: Sclera white.  Moist mucous membranes Cardio: regular rate and rhythm, S1S2 heard, no murmurs appreciated Pulm: clear to auscultation bilaterally, no wheezes, rhonchi or rales; normal work of breathing on room air MSK: Normal gait and station.  Ambulating independently.  She is got normal tone  Assessment/ Plan: 71 y.o. female   Age-related osteoporosis without current pathological fracture - Plan: DG [facility] DEXA, risedronate (ACTONEL) 150 MG tablet  Chronic kidney disease, stage 3a (HCC)  Continue Actonel.  DEXA scan ordered.  CKD 3 is chronic and stable.  I reviewed her most recent labs and appointment with her nephrologist.  No changes.  She may go out to once yearly checkups with me and continue as directed by nephrology with follow-ups there.  No orders of the defined types were placed in this encounter.  No orders of the defined types were placed in this encounter.    Janora Norlander, DO [place] [phone]

## 2022-06-15 NOTE — Patient Instructions (Signed)
Joanne Sullivan can see you tomorrow or Wednesday or any day next week for DEXA scan.

## 2022-06-17 ENCOUNTER — Ambulatory Visit (INDEPENDENT_AMBULATORY_CARE_PROVIDER_SITE_OTHER): Payer: Medicare PPO

## 2022-06-17 DIAGNOSIS — M810 Age-related osteoporosis without current pathological fracture: Secondary | ICD-10-CM

## 2022-06-17 DIAGNOSIS — Z780 Asymptomatic menopausal state: Secondary | ICD-10-CM | POA: Diagnosis not present

## 2022-06-17 DIAGNOSIS — M8589 Other specified disorders of bone density and structure, multiple sites: Secondary | ICD-10-CM | POA: Diagnosis not present

## 2022-08-04 DIAGNOSIS — Z23 Encounter for immunization: Secondary | ICD-10-CM | POA: Diagnosis not present

## 2022-08-14 DIAGNOSIS — Z1231 Encounter for screening mammogram for malignant neoplasm of breast: Secondary | ICD-10-CM | POA: Diagnosis not present

## 2022-10-06 ENCOUNTER — Ambulatory Visit (INDEPENDENT_AMBULATORY_CARE_PROVIDER_SITE_OTHER): Payer: Medicare PPO

## 2022-10-06 DIAGNOSIS — Z23 Encounter for immunization: Secondary | ICD-10-CM

## 2022-10-27 DIAGNOSIS — H43391 Other vitreous opacities, right eye: Secondary | ICD-10-CM | POA: Diagnosis not present

## 2022-11-23 DIAGNOSIS — H5213 Myopia, bilateral: Secondary | ICD-10-CM | POA: Diagnosis not present

## 2022-11-23 DIAGNOSIS — H43391 Other vitreous opacities, right eye: Secondary | ICD-10-CM | POA: Diagnosis not present

## 2022-11-23 DIAGNOSIS — H40023 Open angle with borderline findings, high risk, bilateral: Secondary | ICD-10-CM | POA: Diagnosis not present

## 2022-11-23 DIAGNOSIS — H52223 Regular astigmatism, bilateral: Secondary | ICD-10-CM | POA: Diagnosis not present

## 2022-11-23 DIAGNOSIS — H524 Presbyopia: Secondary | ICD-10-CM | POA: Diagnosis not present

## 2022-11-30 ENCOUNTER — Other Ambulatory Visit: Payer: Medicare PPO

## 2022-11-30 DIAGNOSIS — E870 Hyperosmolality and hypernatremia: Secondary | ICD-10-CM | POA: Diagnosis not present

## 2022-11-30 DIAGNOSIS — N200 Calculus of kidney: Secondary | ICD-10-CM | POA: Diagnosis not present

## 2022-11-30 DIAGNOSIS — I9589 Other hypotension: Secondary | ICD-10-CM | POA: Diagnosis not present

## 2022-11-30 DIAGNOSIS — T452X1D Poisoning by vitamins, accidental (unintentional), subsequent encounter: Secondary | ICD-10-CM | POA: Diagnosis not present

## 2022-11-30 DIAGNOSIS — N1831 Chronic kidney disease, stage 3a: Secondary | ICD-10-CM | POA: Diagnosis not present

## 2022-12-04 DIAGNOSIS — E870 Hyperosmolality and hypernatremia: Secondary | ICD-10-CM | POA: Diagnosis not present

## 2022-12-04 DIAGNOSIS — N1831 Chronic kidney disease, stage 3a: Secondary | ICD-10-CM | POA: Diagnosis not present

## 2022-12-04 DIAGNOSIS — N200 Calculus of kidney: Secondary | ICD-10-CM | POA: Diagnosis not present

## 2022-12-04 DIAGNOSIS — T452X1D Poisoning by vitamins, accidental (unintentional), subsequent encounter: Secondary | ICD-10-CM | POA: Diagnosis not present

## 2022-12-29 ENCOUNTER — Other Ambulatory Visit: Payer: Medicare PPO

## 2022-12-29 DIAGNOSIS — T452X1D Poisoning by vitamins, accidental (unintentional), subsequent encounter: Secondary | ICD-10-CM | POA: Diagnosis not present

## 2022-12-29 DIAGNOSIS — N200 Calculus of kidney: Secondary | ICD-10-CM | POA: Diagnosis not present

## 2022-12-29 DIAGNOSIS — N1831 Chronic kidney disease, stage 3a: Secondary | ICD-10-CM | POA: Diagnosis not present

## 2022-12-29 DIAGNOSIS — E870 Hyperosmolality and hypernatremia: Secondary | ICD-10-CM | POA: Diagnosis not present

## 2023-03-03 ENCOUNTER — Other Ambulatory Visit: Payer: Medicare PPO

## 2023-03-03 DIAGNOSIS — Z79899 Other long term (current) drug therapy: Secondary | ICD-10-CM | POA: Diagnosis not present

## 2023-03-03 DIAGNOSIS — N1831 Chronic kidney disease, stage 3a: Secondary | ICD-10-CM | POA: Diagnosis not present

## 2023-03-03 DIAGNOSIS — T452X1D Poisoning by vitamins, accidental (unintentional), subsequent encounter: Secondary | ICD-10-CM | POA: Diagnosis not present

## 2023-03-03 DIAGNOSIS — E870 Hyperosmolality and hypernatremia: Secondary | ICD-10-CM | POA: Diagnosis not present

## 2023-03-03 DIAGNOSIS — N200 Calculus of kidney: Secondary | ICD-10-CM | POA: Diagnosis not present

## 2023-03-23 DIAGNOSIS — N182 Chronic kidney disease, stage 2 (mild): Secondary | ICD-10-CM | POA: Diagnosis not present

## 2023-03-23 DIAGNOSIS — T452X1D Poisoning by vitamins, accidental (unintentional), subsequent encounter: Secondary | ICD-10-CM | POA: Diagnosis not present

## 2023-03-23 DIAGNOSIS — N200 Calculus of kidney: Secondary | ICD-10-CM | POA: Diagnosis not present

## 2023-03-23 DIAGNOSIS — R031 Nonspecific low blood-pressure reading: Secondary | ICD-10-CM | POA: Diagnosis not present

## 2023-04-18 ENCOUNTER — Other Ambulatory Visit: Payer: Self-pay | Admitting: Family Medicine

## 2023-04-18 DIAGNOSIS — M810 Age-related osteoporosis without current pathological fracture: Secondary | ICD-10-CM

## 2023-05-21 IMAGING — US US RENAL
1 series · 14 of 25 positions shown · non-contrast
Comparison: None available.

CLINICAL DATA: Initial evaluation for chronic kidney disease, stage
IIIA.

EXAM:
RENAL / URINARY TRACT ULTRASOUND COMPLETE

[Series 1: us renal · 14 of 84 slices shown]
[im 1/84]
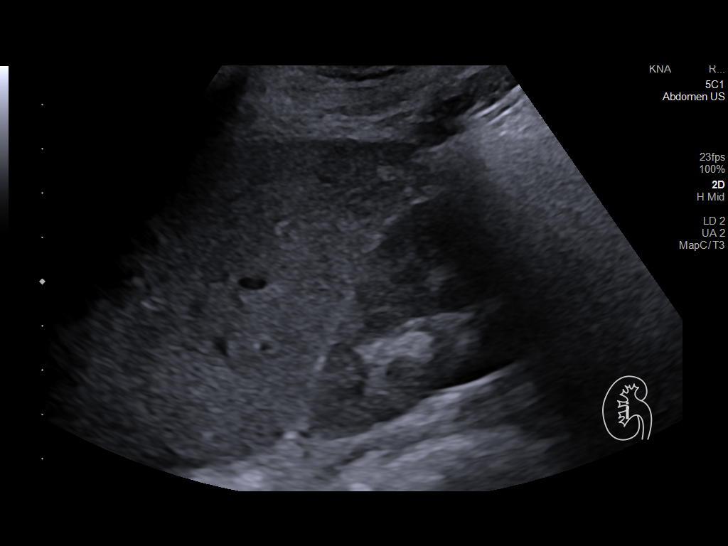
[im 7/84]
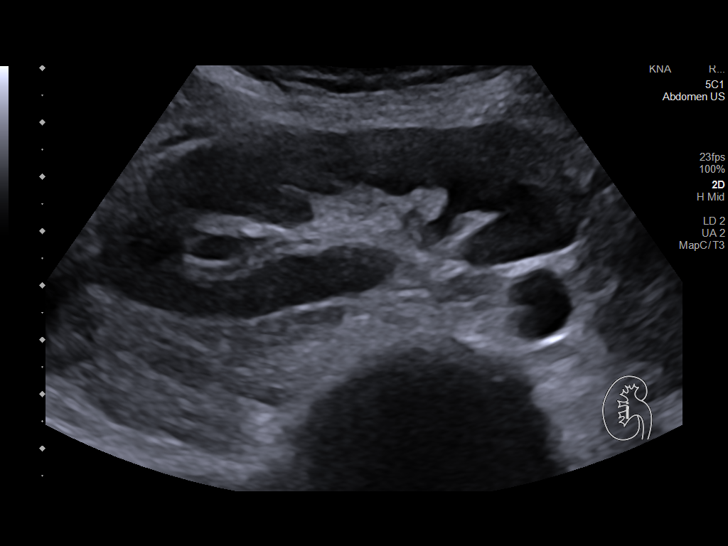
[im 14/84]
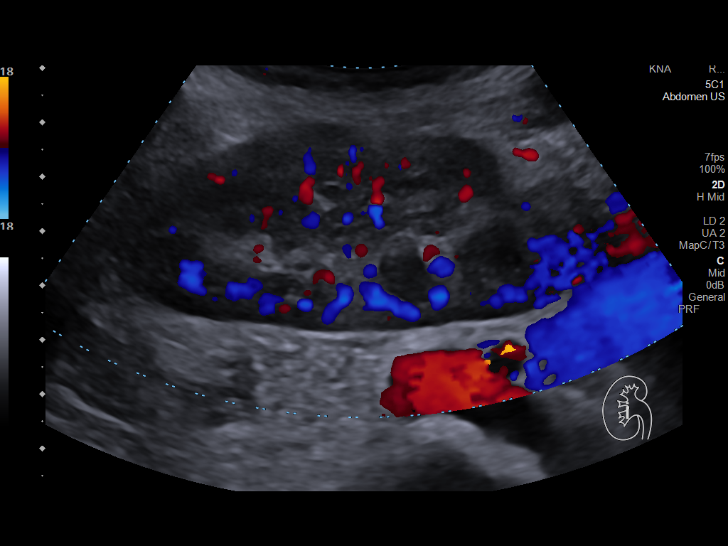
[im 21/84]
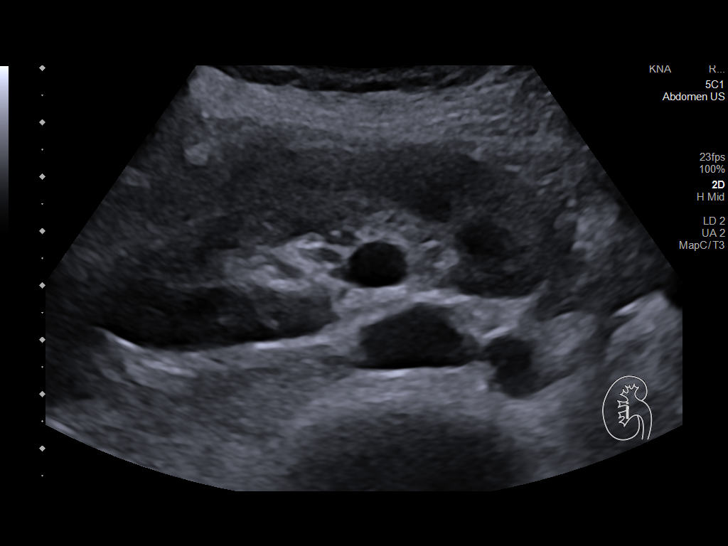
[im 28/84]
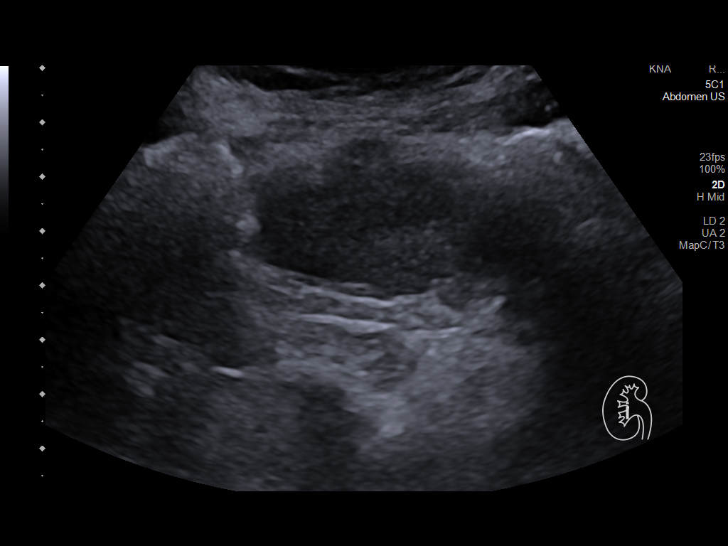
[im 32/84]
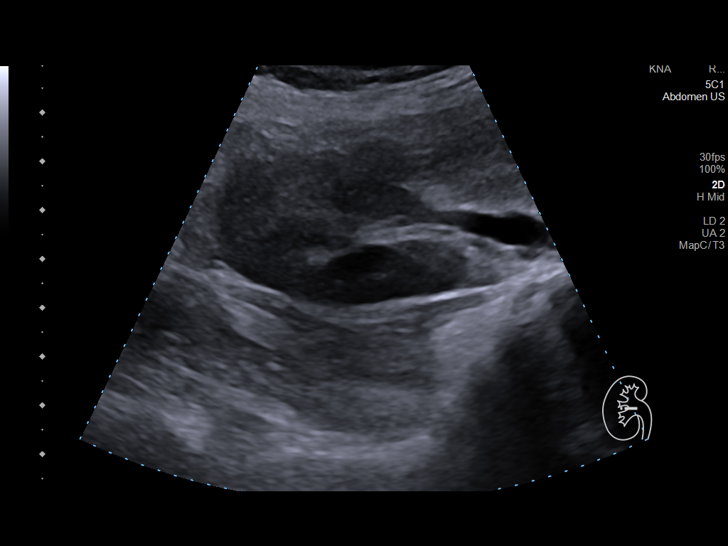
[im 39/84]
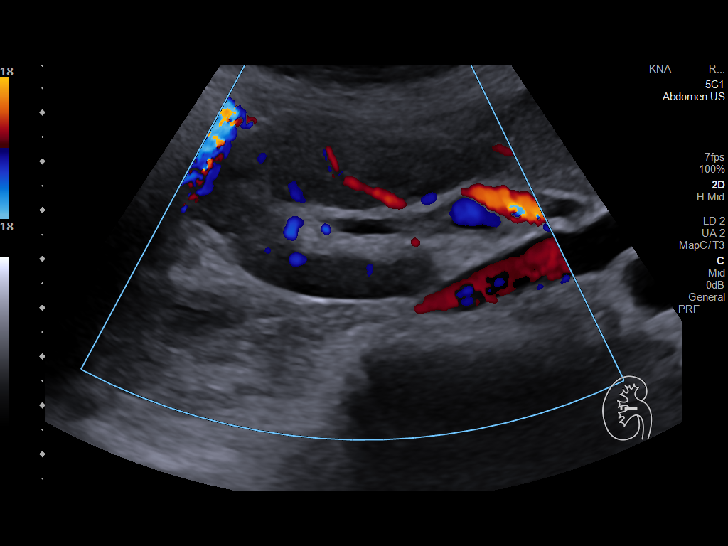
[im 45/84]
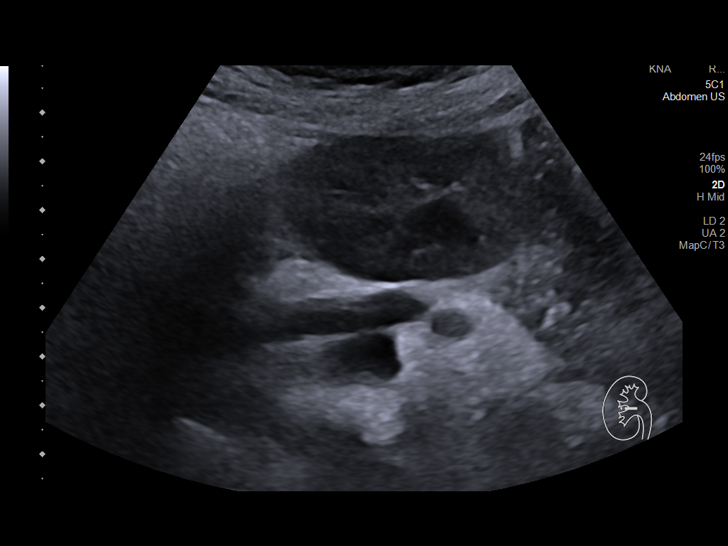
[im 52/84]
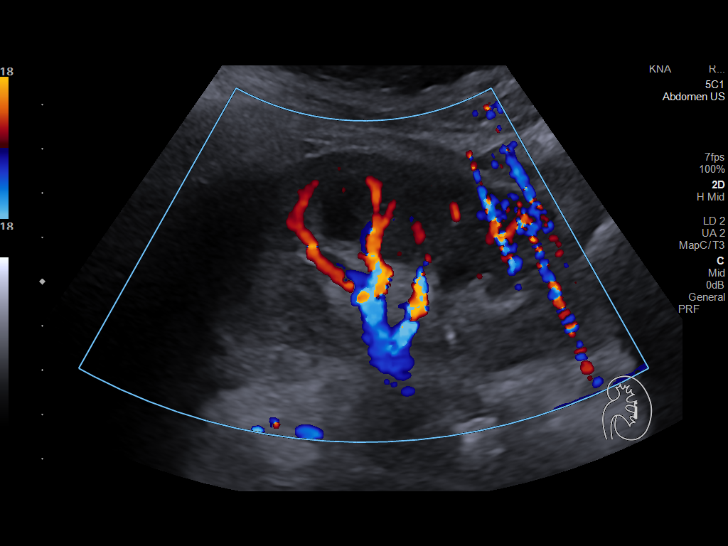
[im 56/84]
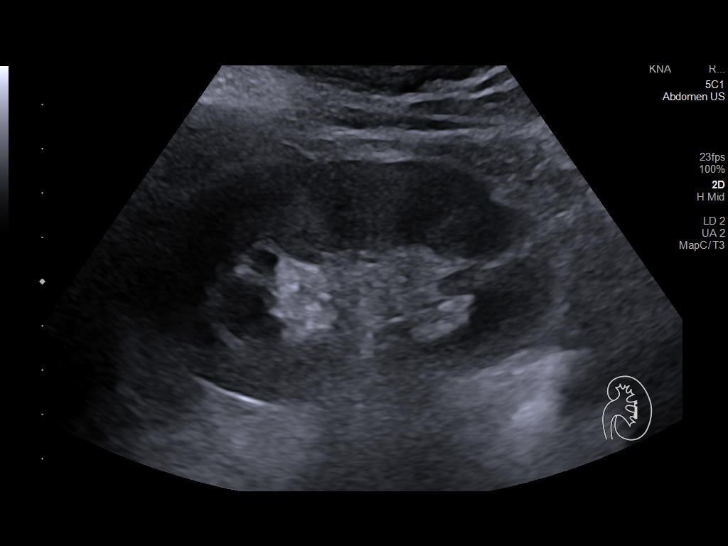
[im 63/84]
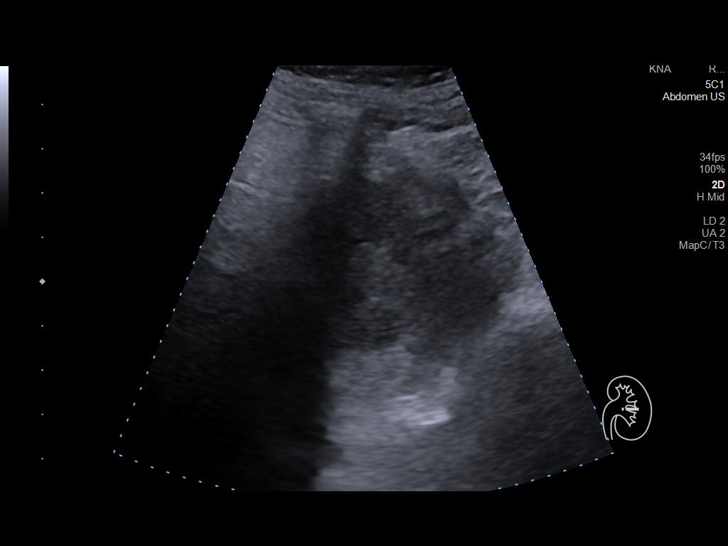
[im 70/84]
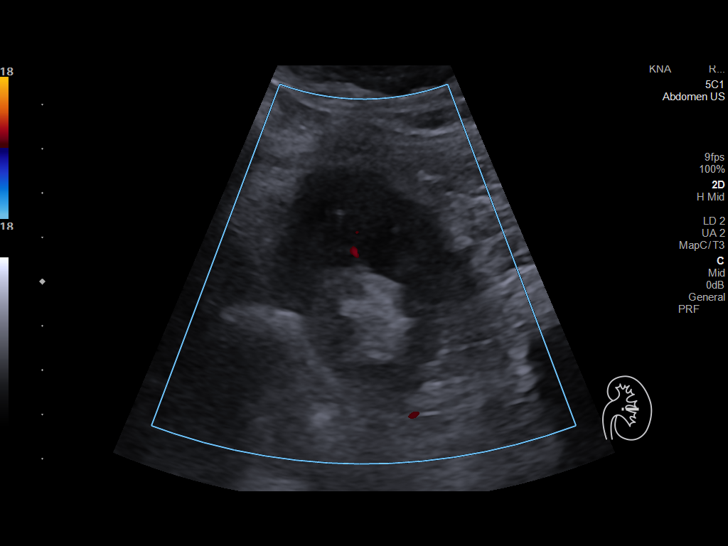
[im 77/84]
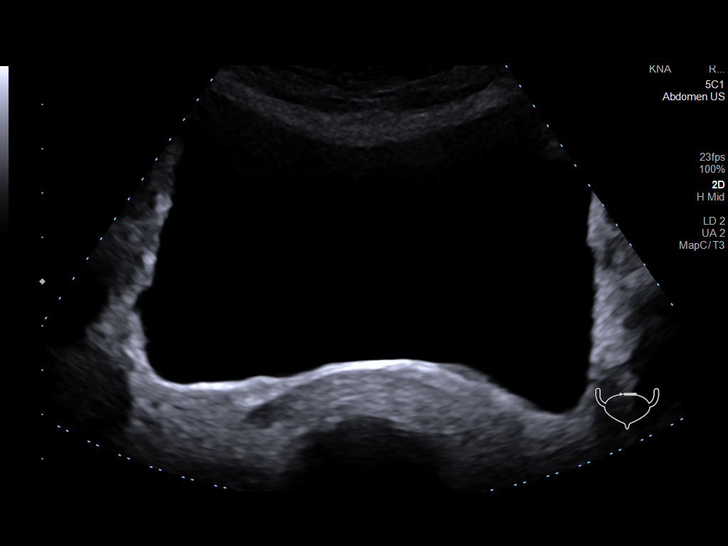
[im 84/84]
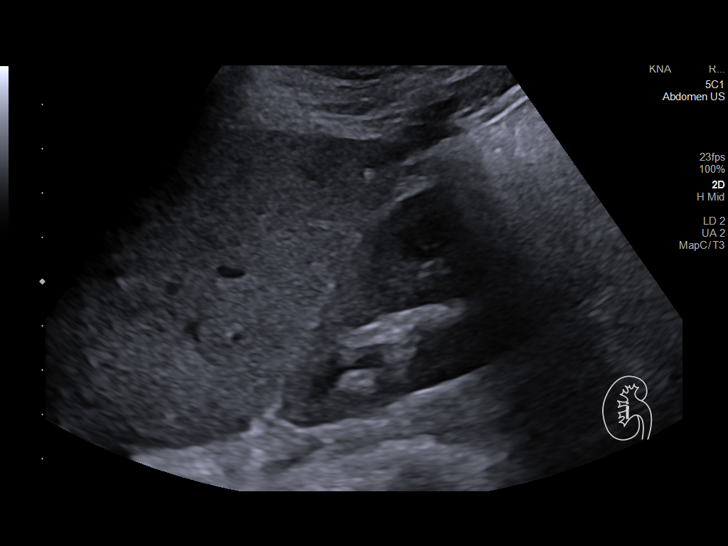

[14 of 25 positions shown; findings below may reference images not displayed]

FINDINGS: Right Kidney:

Renal measurements: 9.2 x 3.3 x 5.9 cm = volume: 92.7 mL. Renal
echogenicity within normal limits. No appreciable cortical thinning.
3 mm echogenic focus at the mid-lower pole suspicious for a small
nonobstructive stone. No hydronephrosis. No focal renal mass.

Left Kidney:

Renal measurements: 9.5 x 5.6 x 4.4 cm = volume: 122.6 mL. Renal
echogenicity within normal limits. No significant cortical thinning.
No nephrolithiasis or hydronephrosis. No focal renal mass.

Bladder:

Appears normal for degree of bladder distention. Bilateral jets are
visualized.

Other:

None.
IMPRESSION: 1. 3 mm nonobstructive right renal nephrolithiasis.
2. Otherwise unremarkable and normal renal ultrasound. No
hydronephrosis or other significant finding.

## 2023-06-02 ENCOUNTER — Telehealth: Payer: Self-pay | Admitting: Family Medicine

## 2023-06-02 NOTE — Telephone Encounter (Signed)
Patient has CPE on 8/23 and lab appt 8/19, please add orders.

## 2023-06-03 ENCOUNTER — Ambulatory Visit (INDEPENDENT_AMBULATORY_CARE_PROVIDER_SITE_OTHER): Payer: Medicare PPO

## 2023-06-03 VITALS — Ht 61.0 in | Wt 115.0 lb

## 2023-06-03 DIAGNOSIS — Z0000 Encounter for general adult medical examination without abnormal findings: Secondary | ICD-10-CM

## 2023-06-03 NOTE — Patient Instructions (Signed)
Joanne Sullivan , Thank you for taking time to come for your Medicare Wellness Visit. I appreciate your ongoing commitment to your health goals. Please review the following plan we discussed and let me know if I can assist you in the future.   Referrals/Orders/Follow-Ups/Clinician Recommendations: Aim for 30 minutes of exercise or brisk walking, 6-8 glasses of water, and 5 servings of fruits and vegetables each day.   This is a list of the screening recommended for you and due dates:  Health Maintenance  Topic Date Due   COVID-19 Vaccine (3 - Moderna risk series) 01/17/2020   Flu Shot  05/27/2023   Medicare Annual Wellness Visit  06/02/2024   DEXA scan (bone density measurement)  06/17/2024   Mammogram  08/14/2024   Colon Cancer Screening  03/26/2027   DTaP/Tdap/Td vaccine (3 - Td or Tdap) 05/31/2031   Pneumonia Vaccine  Completed   Hepatitis C Screening  Completed   Zoster (Shingles) Vaccine  Completed   HPV Vaccine  Aged Out    Advanced directives: (Copy Requested) Please bring a copy of your health care power of attorney and living will to the office to be added to your chart at your convenience.  Next Medicare Annual Wellness Visit scheduled for next year: Yes  Preventive Care 25 Years and Older, Female Preventive care refers to lifestyle choices and visits with your health care provider that can promote health and wellness. What does preventive care include? A yearly physical exam. This is also called an annual well check. Dental exams once or twice a year. Routine eye exams. Ask your health care provider how often you should have your eyes checked. Personal lifestyle choices, including: Daily care of your teeth and gums. Regular physical activity. Eating a healthy diet. Avoiding tobacco and drug use. Limiting alcohol use. Practicing safe sex. Taking low-dose aspirin every day. Taking vitamin and mineral supplements as recommended by your health care provider. What  happens during an annual well check? The services and screenings done by your health care provider during your annual well check will depend on your age, overall health, lifestyle risk factors, and family history of disease. Counseling  Your health care provider may ask you questions about your: Alcohol use. Tobacco use. Drug use. Emotional well-being. Home and relationship well-being. Sexual activity. Eating habits. History of falls. Memory and ability to understand (cognition). Work and work Astronomer. Reproductive health. Screening  You may have the following tests or measurements: Height, weight, and BMI. Blood pressure. Lipid and cholesterol levels. These may be checked every 5 years, or more frequently if you are over 50 years old. Skin check. Lung cancer screening. You may have this screening every year starting at age 65 if you have a 30-pack-year history of smoking and currently smoke or have quit within the past 15 years. Fecal occult blood test (FOBT) of the stool. You may have this test every year starting at age 71. Flexible sigmoidoscopy or colonoscopy. You may have a sigmoidoscopy every 5 years or a colonoscopy every 10 years starting at age 29. Hepatitis C blood test. Hepatitis B blood test. Sexually transmitted disease (STD) testing. Diabetes screening. This is done by checking your blood sugar (glucose) after you have not eaten for a while (fasting). You may have this done every 1-3 years. Bone density scan. This is done to screen for osteoporosis. You may have this done starting at age 52. Mammogram. This may be done every 1-2 years. Talk to your health care provider about how  often you should have regular mammograms. Talk with your health care provider about your test results, treatment options, and if necessary, the need for more tests. Vaccines  Your health care provider may recommend certain vaccines, such as: Influenza vaccine. This is recommended every  year. Tetanus, diphtheria, and acellular pertussis (Tdap, Td) vaccine. You may need a Td booster every 10 years. Zoster vaccine. You may need this after age 46. Pneumococcal 13-valent conjugate (PCV13) vaccine. One dose is recommended after age 10. Pneumococcal polysaccharide (PPSV23) vaccine. One dose is recommended after age 72. Talk to your health care provider about which screenings and vaccines you need and how often you need them. This information is not intended to replace advice given to you by your health care provider. Make sure you discuss any questions you have with your health care provider. Document Released: 11/08/2015 Document Revised: 07/01/2016 Document Reviewed: 08/13/2015 Elsevier Interactive Patient Education  2017 [employer].  Fall Prevention in the Home Falls can cause injuries. They can happen to people of all ages. There are many things you can do to make your home safe and to help prevent falls. What can I do on the outside of my home? Regularly fix the edges of walkways and driveways and fix any cracks. Remove anything that might make you trip as you walk through a door, such as a raised step or threshold. Trim any bushes or trees on the path to your home. Use bright outdoor lighting. Clear any walking paths of anything that might make someone trip, such as rocks or tools. Regularly check to see if handrails are loose or broken. Make sure that both sides of any steps have handrails. Any raised decks and porches should have guardrails on the edges. Have any leaves, snow, or ice cleared regularly. Use sand or salt on walking paths during winter. Clean up any spills in your garage right away. This includes oil or grease spills. What can I do in the bathroom? Use night lights. Install grab bars by the toilet and in the tub and shower. Do not use towel bars as grab bars. Use non-skid mats or decals in the tub or shower. If you need to sit down in the shower, use a  plastic, non-slip stool. Keep the floor dry. Clean up any water that spills on the floor as soon as it happens. Remove soap buildup in the tub or shower regularly. Attach bath mats securely with double-sided non-slip rug tape. Do not have throw rugs and other things on the floor that can make you trip. What can I do in the bedroom? Use night lights. Make sure that you have a light by your bed that is easy to reach. Do not use any sheets or blankets that are too big for your bed. They should not hang down onto the floor. Have a firm chair that has side arms. You can use this for support while you get dressed. Do not have throw rugs and other things on the floor that can make you trip. What can I do in the kitchen? Clean up any spills right away. Avoid walking on wet floors. Keep items that you use a lot in easy-to-reach places. If you need to reach something above you, use a strong step stool that has a grab bar. Keep electrical cords out of the way. Do not use floor polish or wax that makes floors slippery. If you must use wax, use non-skid floor wax. Do not have throw rugs and other things  on the floor that can make you trip. What can I do with my stairs? Do not leave any items on the stairs. Make sure that there are handrails on both sides of the stairs and use them. Fix handrails that are broken or loose. Make sure that handrails are as long as the stairways. Check any carpeting to make sure that it is firmly attached to the stairs. Fix any carpet that is loose or worn. Avoid having throw rugs at the top or bottom of the stairs. If you do have throw rugs, attach them to the floor with carpet tape. Make sure that you have a light switch at the top of the stairs and the bottom of the stairs. If you do not have them, ask someone to add them for you. What else can I do to help prevent falls? Wear shoes that: Do not have high heels. Have rubber bottoms. Are comfortable and fit you  well. Are closed at the toe. Do not wear sandals. If you use a stepladder: Make sure that it is fully opened. Do not climb a closed stepladder. Make sure that both sides of the stepladder are locked into place. Ask someone to hold it for you, if possible. Clearly mark and make sure that you can see: Any grab bars or handrails. First and last steps. Where the edge of each step is. Use tools that help you move around (mobility aids) if they are needed. These include: Canes. Walkers. Scooters. Crutches. Turn on the lights when you go into a dark area. Replace any light bulbs as soon as they burn out. Set up your furniture so you have a clear path. Avoid moving your furniture around. If any of your floors are uneven, fix them. If there are any pets around you, be aware of where they are. Review your medicines with your doctor. Some medicines can make you feel dizzy. This can increase your chance of falling. Ask your doctor what other things that you can do to help prevent falls. This information is not intended to replace advice given to you by your health care provider. Make sure you discuss any questions you have with your health care provider. Document Released: 08/08/2009 Document Revised: 03/19/2016 Document Reviewed: 11/16/2014 Elsevier Interactive Patient Education  2017 [employer].

## 2023-06-03 NOTE — Progress Notes (Signed)
Subjective:   Joanne Sullivan is a 72 y.o. female who presents for Medicare Annual (Subsequent) preventive examination.  Visit Complete: Virtual  I connected with  [name] on 06/03/23 by a audio enabled telemedicine application and verified that I am speaking with the correct person using two identifiers.  Patient Location: Home  Provider Location: Home Office  I discussed the limitations of evaluation and management by telemedicine. The patient expressed understanding and agreed to proceed.  Patient Medicare AWV questionnaire was completed by the patient on 06/03/2023; I have confirmed that all information answered by patient is correct and no changes since this date.  Review of Systems    Vital Signs: Unable to obtain new vitals due to this being a telehealth visit.  Cardiac Risk Factors include: advanced age (>62men, >31 women)     Objective:    Today's Vitals   06/03/23 1303  Weight: 115 lb (52.2 kg)  Height: 5\' 1"  (1.549 m)   Body mass index is 21.73 kg/m.     06/03/2023    1:06 PM 06/01/2022    3:46 PM 05/29/2021    4:27 PM  Advanced Directives  Does Patient Have a Medical Advance Directive? Yes Yes No  Type of Estate agent of [place];Living will Healthcare Power of [place];Living will   Copy of Healthcare Power of Attorney in Chart? No - copy requested No - copy requested   Would patient like information on creating a medical advance directive?   Yes (MAU/Ambulatory/Procedural Areas - Information given)    Current Medications (verified) Outpatient Encounter Medications as of 06/03/2023  Medication Sig   Biotin 10 MG TABS Take by mouth.   risedronate (ACTONEL) 150 MG tablet TAKE 1 TABLET EVERY 30 DAYS. WITH WATER ON EMPTY STOMACH, NOTHING BY MOUTH OR LIE DOWN FOR NEXT 30 MINUTES.   Calcium Carbonate-Vit D-Min (CALTRATE 600+D PLUS MINERALS) 600-800 MG-UNIT TABS Take 2 tablets by mouth daily.   No facility-administered  encounter medications on file as of 06/03/2023.    Allergies (verified) Patient has no known allergies.   History: Past Medical History:  Diagnosis Date   Allergy    Glaucoma suspect    Heart murmur    Seasonal allergic reaction    Past Surgical History:  Procedure Laterality Date   left radial head fracture Left    Family History  Problem Relation Age of Onset   Heart disease Mother        2 MIs   Hypertension Mother    Heart disease Father 59       AMI   Cancer Sister        ovarian   Social History   Socioeconomic History   Marital status: Married    Spouse name: [name]   Number of children: 1   Years of education: Not on file   Highest education level: Not on file  Occupational History   Occupation: retired  Tobacco Use   Smoking status: Never   Smokeless tobacco: Never  Vaping Use   Vaping status: Never Used  Substance and Sexual Activity   Alcohol use: No   Drug use: No   Sexual activity: Not on file  Other Topics Concern   Not on file  Social History Narrative   Lives home with husband. Son lives in [place].   Very active - goes to the gym, plays pickle ball, goes line dancing   Active member at church and ladies group   Social Determinants of Health  Financial Resource Strain: Low Risk  (06/03/2023)   Overall Financial Resource Strain (CARDIA)    Difficulty of Paying Living Expenses: Not hard at all  Food Insecurity: No Food Insecurity (06/03/2023)   Hunger Vital Sign    Worried About Running Out of Food in the Last Year: Never true    Ran Out of Food in the Last Year: Never true  Transportation Needs: No Transportation Needs (06/03/2023)   PRAPARE - Administrator, Civil Service (Medical): No    Lack of Transportation (Non-Medical): No  Physical Activity: Sufficiently Active (06/03/2023)   Exercise Vital Sign    Days of Exercise per Week: 5 days    Minutes of Exercise per Session: 60 min  Stress: No Stress Concern Present (06/03/2023)    [name] of Occupational Health - Occupational Stress Questionnaire    Feeling of Stress : Not at all  Social Connections: Socially Integrated (06/03/2023)   Social Connection and Isolation Panel [NHANES]    Frequency of Communication with Friends and Family: More than three times a week    Frequency of Social Gatherings with Friends and Family: More than three times a week    Attends Religious Services: More than 4 times per year    Active Member of [employer] or Organizations: Yes    Attends Engineer, structural: More than 4 times per year    Marital Status: Married    Tobacco Counseling Counseling given: Not Answered   Clinical Intake:  Pre-visit preparation completed: Yes  Pain : No/denies pain     Nutritional Risks: None Diabetes: No  How often do you need to have someone help you when you read instructions, pamphlets, or other written materials from your doctor or pharmacy?: 1 - Never  Interpreter Needed?: No  Information entered by :: [name], LPN   Activities of Daily Living    06/03/2023    1:06 PM 06/02/2023    4:09 PM  In your present state of health, do you have any difficulty performing the following activities:  Hearing? 0 0  Vision? 0 0  Difficulty concentrating or making decisions? 0 0  Walking or climbing stairs? 0 0  Dressing or bathing? 0   Doing errands, shopping? 0 0  Preparing Food and eating ? N N  Using the Toilet? N N  In the past six months, have you accidently leaked urine? N N  Do you have problems with loss of bowel control? N N  Managing your Medications? N N  Managing your Finances? N N  Housekeeping or managing your Housekeeping? N N    Patient Care Team: Raliegh Ip, DO as PCP - General (Family Medicine)  Indicate any recent Medical Services you may have received from other than [facility] providers in the past year (date may be approximate).     Assessment:   This is a routine wellness examination for  [facility].  Hearing/Vision screen Vision Screening - Comments:: Wears rx glasses - up to date with routine eye exams with  Dr.Doub  Dietary issues and exercise activities discussed:     Goals Addressed             This Visit's Progress    DIET - INCREASE WATER INTAKE   On track      Depression Screen    06/03/2023    1:05 PM 06/15/2022    3:00 PM 06/01/2022    3:40 PM 11/27/2021    3:50 PM 05/30/2021  8:07 AM 05/29/2021    4:21 PM 05/15/2020    3:09 PM  PHQ 2/9 Scores  PHQ - 2 Score 0 0 0 0 0 0 0  PHQ- 9 Score       0    Fall Risk    06/03/2023    1:04 PM 06/02/2023    4:09 PM 06/15/2022    3:00 PM 06/01/2022    3:39 PM 11/27/2021    3:50 PM  Fall Risk   Falls in the past year? 0 0 0 0 1  Number falls in past yr: 0   0 1  Injury with Fall? 0   0 0  Risk for fall due to : No Fall Risks   Orthopedic patient History of fall(s)  Follow up Falls prevention discussed   Falls prevention discussed Education provided    MEDICARE RISK AT HOME:  Medicare Risk at Home - 06/03/23 1305     Any stairs in or around the home? No    If so, are there any without handrails? No    Home free of loose throw rugs in walkways, pet beds, electrical cords, etc? Yes    Adequate lighting in your home to reduce risk of falls? Yes    Life alert? No    Use of a cane, walker or w/c? No    Grab bars in the bathroom? Yes    Shower chair or bench in shower? Yes    Elevated toilet seat or a handicapped toilet? Yes             TIMED UP AND GO:  Was the test performed?  No    Cognitive Function:        06/03/2023    1:07 PM 06/01/2022    3:41 PM 05/29/2021    4:26 PM  6CIT Screen  What Year? 0 points 0 points 0 points  What month? 0 points 0 points 0 points  What time? 0 points 0 points 0 points  Count back from 20 0 points 0 points 0 points  Months in reverse 0 points 0 points 0 points  Repeat phrase 0 points 0 points 0 points  Total Score 0 points 0 points 0 points     Immunizations Immunization History  Administered Date(s) Administered   Influenza,inj,Quad PF,6+ Mos 08/04/2013, 10/30/2016   Moderna Sars-Covid-2 Vaccination 11/24/2019, 12/20/2019   Pneumococcal Conjugate-13 04/11/2019   Pneumococcal Polysaccharide-23 05/15/2020   Td 05/30/2021   Tdap 07/24/2011   Zoster Recombinant(Shingrix) 06/15/2022, 10/06/2022    TDAP status: Due, Education has been provided regarding the importance of this vaccine. Advised may receive this vaccine at local pharmacy or Health Dept. Aware to provide a copy of the vaccination record if obtained from local pharmacy or Health Dept. Verbalized acceptance and understanding.  Flu Vaccine status: Declined, Education has been provided regarding the importance of this vaccine but patient still declined. Advised may receive this vaccine at local pharmacy or Health Dept. Aware to provide a copy of the vaccination record if obtained from local pharmacy or Health Dept. Verbalized acceptance and understanding.  Pneumococcal vaccine status: Up to date  Covid-19 vaccine status: Completed vaccines  Qualifies for Shingles Vaccine? Yes   Zostavax completed No   Shingrix Completed?: No.    Education has been provided regarding the importance of this vaccine. Patient has been advised to call insurance company to determine out of pocket expense if they have not yet received this vaccine. Advised may also receive vaccine at  local pharmacy or Health Dept. Verbalized acceptance and understanding.  Screening Tests Health Maintenance  Topic Date Due   COVID-19 Vaccine (3 - Moderna risk series) 01/17/2020   INFLUENZA VACCINE  05/27/2023   Medicare Annual Wellness (AWV)  06/02/2024   DEXA SCAN  06/17/2024   MAMMOGRAM  08/14/2024   Colonoscopy  03/26/2027   DTaP/Tdap/Td (3 - Td or Tdap) 05/31/2031   Pneumonia Vaccine 20+ Years old  Completed   Hepatitis C Screening  Completed   Zoster Vaccines- Shingrix  Completed   HPV VACCINES   Aged Out    Health Maintenance  Health Maintenance Due  Topic Date Due   COVID-19 Vaccine (3 - Moderna risk series) 01/17/2020   INFLUENZA VACCINE  05/27/2023    Colorectal cancer screening: Type of screening: Colonoscopy. Completed 03/25/2017. Repeat every 10 years  Mammogram status: Completed 08/14/2022. Repeat every year  Bone Density status: Completed 06/17/2022. Results reflect: Bone density results: OSTEOPOROSIS. Repeat every 2 years.  Lung Cancer Screening: (Low Dose CT Chest recommended if Age 10-80 years, 20 pack-year currently smoking OR have quit w/in 15years.) does not qualify.   Lung Cancer Screening Referral: n/a  Additional Screening:  Hepatitis C Screening: does not qualify; Completed 02/04/2018  Vision Screening: Recommended annual ophthalmology exams for early detection of glaucoma and other disorders of the eye. Is the patient up to date with their annual eye exam?  Yes  Who is the provider or what is the name of the office in which the patient attends annual eye exams? Dr.Doub  If pt is not established with a provider, would they like to be referred to a provider to establish care? No .   Dental Screening: Recommended annual dental exams for proper oral hygiene    Community Resource Referral / Chronic Care Management: CRR required this visit?  No   CCM required this visit?  No     Plan:     I have personally reviewed and noted the following in the patient's chart:   Medical and social history Use of alcohol, tobacco or illicit drugs  Current medications and supplements including opioid prescriptions. Patient is not currently taking opioid prescriptions. Functional ability and status Nutritional status Physical activity Advanced directives List of other physicians Hospitalizations, surgeries, and ER visits in previous 12 months Vitals Screenings to include cognitive, depression, and falls Referrals and appointments  In addition, I have  reviewed and discussed with patient certain preventive protocols, quality metrics, and best practice recommendations. A written personalized care plan for preventive services as well as general preventive health recommendations were provided to patient.     Lorrene Reid, LPN   0/11/7251   After Visit Summary: (MyChart) Due to this being a telephonic visit, the after visit summary with patients personalized plan was offered to patient via MyChart   Nurse Notes: Due Tdap Vaccine

## 2023-06-04 ENCOUNTER — Other Ambulatory Visit: Payer: Self-pay

## 2023-06-04 DIAGNOSIS — N1831 Chronic kidney disease, stage 3a: Secondary | ICD-10-CM

## 2023-06-04 DIAGNOSIS — E559 Vitamin D deficiency, unspecified: Secondary | ICD-10-CM

## 2023-06-04 DIAGNOSIS — Z13220 Encounter for screening for lipoid disorders: Secondary | ICD-10-CM

## 2023-06-14 ENCOUNTER — Other Ambulatory Visit: Payer: Medicare PPO

## 2023-06-14 DIAGNOSIS — E559 Vitamin D deficiency, unspecified: Secondary | ICD-10-CM

## 2023-06-14 DIAGNOSIS — Z13220 Encounter for screening for lipoid disorders: Secondary | ICD-10-CM | POA: Diagnosis not present

## 2023-06-14 DIAGNOSIS — N1831 Chronic kidney disease, stage 3a: Secondary | ICD-10-CM

## 2023-06-15 LAB — LIPID PANEL
Chol/HDL Ratio: 1.6 ratio (ref 0.0–4.4)
Cholesterol, Total: 134 mg/dL (ref 100–199)
HDL: 84 mg/dL (ref >39)
LDL Chol Calc (NIH): 33 mg/dL (ref 0–99)
Triglycerides: 92 mg/dL (ref 0–149)
VLDL Cholesterol Cal: 17 mg/dL (ref 5–40)

## 2023-06-15 LAB — CBC WITH DIFFERENTIAL/PLATELET
Basophils Absolute: 0.1 10*3/uL (ref 0.0–0.2)
Basos: 2 %
EOS (ABSOLUTE): 0.1 10*3/uL (ref 0.0–0.4)
Eos: 4 %
Hematocrit: 43.5 % (ref 34.0–46.6)
Hemoglobin: 14.1 g/dL (ref 11.1–15.9)
Immature Grans (Abs): 0 10*3/uL (ref 0.0–0.1)
Immature Granulocytes: 0 %
Lymphocytes Absolute: 1.3 10*3/uL (ref 0.7–3.1)
Lymphs: 39 %
MCH: 30.1 pg (ref 26.6–33.0)
MCHC: 32.4 g/dL (ref 31.5–35.7)
MCV: 93 fL (ref 79–97)
Monocytes Absolute: 0.3 10*3/uL (ref 0.1–0.9)
Monocytes: 10 %
Neutrophils Absolute: 1.5 10*3/uL (ref 1.4–7.0)
Neutrophils: 45 %
Platelets: 186 10*3/uL (ref 150–450)
RBC: 4.68 x10E6/uL (ref 3.77–5.28)
RDW: 12.4 % (ref 11.7–15.4)
WBC: 3.4 10*3/uL (ref 3.4–10.8)

## 2023-06-15 LAB — CMP14+EGFR
ALT: 15 IU/L (ref 0–32)
AST: 22 IU/L (ref 0–40)
Albumin: 4.3 g/dL (ref 3.9–4.9)
Alkaline Phosphatase: 84 IU/L (ref 44–121)
BUN/Creatinine Ratio: 15 (ref 12–28)
BUN: 15 mg/dL (ref 8–27)
Bilirubin Total: 0.6 mg/dL (ref 0.0–1.2)
CO2: 26 mmol/L (ref 20–29)
Calcium: 9.1 mg/dL (ref 8.7–10.3)
Chloride: 102 mmol/L (ref 96–106)
Creatinine, Ser: 1.01 mg/dL — ABNORMAL HIGH (ref 0.57–1.00)
Globulin, Total: 2.5 g/dL (ref 1.5–4.5)
Glucose: 89 mg/dL (ref 70–99)
Potassium: 4.1 mmol/L (ref 3.5–5.2)
Sodium: 140 mmol/L (ref 134–144)
Total Protein: 6.8 g/dL (ref 6.0–8.5)
eGFR: 60 mL/min/{1.73_m2} (ref >59)

## 2023-06-15 LAB — VITAMIN D 25 HYDROXY (VIT D DEFICIENCY, FRACTURES): Vit D, 25-Hydroxy: 65.6 ng/mL (ref 30.0–100.0)

## 2023-06-18 ENCOUNTER — Encounter: Payer: Self-pay | Admitting: Family Medicine

## 2023-06-18 ENCOUNTER — Ambulatory Visit (INDEPENDENT_AMBULATORY_CARE_PROVIDER_SITE_OTHER): Payer: Medicare PPO | Admitting: Family Medicine

## 2023-06-18 VITALS — BP 111/72 | HR 82 | Temp 98.3°F | Ht 61.0 in | Wt 118.0 lb

## 2023-06-18 DIAGNOSIS — Z0001 Encounter for general adult medical examination with abnormal findings: Secondary | ICD-10-CM | POA: Diagnosis not present

## 2023-06-18 DIAGNOSIS — N1831 Chronic kidney disease, stage 3a: Secondary | ICD-10-CM

## 2023-06-18 DIAGNOSIS — Z0000 Encounter for general adult medical examination without abnormal findings: Secondary | ICD-10-CM

## 2023-06-18 DIAGNOSIS — M810 Age-related osteoporosis without current pathological fracture: Secondary | ICD-10-CM | POA: Diagnosis not present

## 2023-06-18 NOTE — Progress Notes (Unsigned)
Joanne Sullivan is a 72 y.o. female presents to office today for annual physical exam examination.    Concerns today include: 1. ***  Occupation: ***, Marital status: ***, Substance use: *** Health Maintenance Due  Topic Date Due   COVID-19 Vaccine (3 - Moderna risk series) 01/17/2020   INFLUENZA VACCINE  05/27/2023   Refills needed today: ***  Immunization History  Administered Date(s) Administered   Influenza,inj,Quad PF,6+ Mos 08/04/2013, 10/30/2016   Moderna Sars-Covid-2 Vaccination 11/24/2019, 12/20/2019   Pneumococcal Conjugate-13 04/11/2019   Pneumococcal Polysaccharide-23 05/15/2020   Td 05/30/2021   Tdap 07/24/2011   Zoster Recombinant(Shingrix) 06/15/2022, 10/06/2022   Past Medical History:  Diagnosis Date   Allergy    Glaucoma suspect    Heart murmur    Seasonal allergic reaction    Social History   Socioeconomic History   Marital status: Married    Spouse name: Joanne Sullivan   Number of children: 1   Years of education: Not on file   Highest education level: Not on file  Occupational History   Occupation: retired  Tobacco Use   Smoking status: Never   Smokeless tobacco: Never  Vaping Use   Vaping status: Never Used  Substance and Sexual Activity   Alcohol use: No   Drug use: No   Sexual activity: Not on file  Other Topics Concern   Not on file  Social History Narrative   Lives home with husband. Son lives in [place].   Very active - goes to the gym, plays pickle ball, goes line dancing   Active member at church and ladies group   Social Determinants of Health   Financial Resource Strain: Low Risk  (06/03/2023)   Overall Financial Resource Strain (CARDIA)    Difficulty of Paying Living Expenses: Not hard at all  Food Insecurity: No Food Insecurity (06/03/2023)   Hunger Vital Sign    Worried About Running Out of Food in the Last Year: Never true    Ran Out of Food in the Last Year: Never true  Transportation Needs: No Transportation Needs  (06/03/2023)   PRAPARE - Administrator, Civil Service (Medical): No    Lack of Transportation (Non-Medical): No  Physical Activity: Sufficiently Active (06/03/2023)   Exercise Vital Sign    Days of Exercise per Week: 5 days    Minutes of Exercise per Session: 60 min  Stress: No Stress Concern Present (06/03/2023)   [name] of Occupational Health - Occupational Stress Questionnaire    Feeling of Stress : Not at all  Social Connections: Socially Integrated (06/03/2023)   Social Connection and Isolation Panel [NHANES]    Frequency of Communication with Friends and Family: More than three times a week    Frequency of Social Gatherings with Friends and Family: More than three times a week    Attends Religious Services: More than 4 times per year    Active Member of [employer] or Organizations: Yes    Attends Engineer, structural: More than 4 times per year    Marital Status: Married  Catering manager Violence: Not At Risk (06/03/2023)   Humiliation, Afraid, Rape, and Kick questionnaire    Fear of Current or Ex-Partner: No    Emotionally Abused: No    Physically Abused: No    Sexually Abused: No   Past Surgical History:  Procedure Laterality Date   left radial head fracture Left    Family History  Problem Relation Age of Onset   Heart disease  Mother        2 MIs   Hypertension Mother    Heart disease Father 54       AMI   Cancer Sister        ovarian    Current Outpatient Medications:    Biotin 10 MG TABS, Take by mouth., Disp: , Rfl:    Calcium Carbonate-Vit D-Min (CALTRATE 600+D PLUS MINERALS) 600-800 MG-UNIT TABS, Take 2 tablets by mouth daily., Disp: , Rfl:    risedronate (ACTONEL) 150 MG tablet, TAKE 1 TABLET EVERY 30 DAYS. WITH WATER ON EMPTY STOMACH, NOTHING BY MOUTH OR LIE DOWN FOR NEXT 30 MINUTES., Disp: 4 tablet, Rfl: 0  No Known Allergies   ROS: Review of Systems {ros; complete:30496}    Physical exam {Exam, Complete:[phone]}       06/03/2023    1:05 PM 06/15/2022    3:00 PM 06/01/2022    3:40 PM  Depression screen PHQ 2/9  Decreased Interest 0 0 0  Down, Depressed, Hopeless 0 0 0  PHQ - 2 Score 0 0 0      06/15/2022    3:00 PM 11/27/2021    3:50 PM 05/30/2021    8:07 AM  GAD 7 : Generalized Anxiety Score  Nervous, Anxious, on Edge 0 0 0  Control/stop worrying 0 0 0  Worry too much - different things 0 0 0  Trouble relaxing 0 0 0  Restless 0 0 0  Easily annoyed or irritable 0 0 0  Afraid - awful might happen 0 0 0  Total GAD 7 Score 0 0 0  Anxiety Difficulty Not difficult at all Not difficult at all Not difficult at all     Assessment/ Plan: Joanne Sullivan here for annual physical exam.   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  Patient to follow up ***  Thijs Brunton M. Nadine Counts, DO

## 2023-06-19 ENCOUNTER — Encounter: Payer: Self-pay | Admitting: Family Medicine

## 2023-06-19 MED ORDER — RISEDRONATE SODIUM 150 MG PO TABS
150.0000 mg | ORAL_TABLET | ORAL | 4 refills | Status: DC
Start: 2023-06-19 — End: 2024-06-20

## 2023-08-19 DIAGNOSIS — R92333 Mammographic heterogeneous density, bilateral breasts: Secondary | ICD-10-CM | POA: Diagnosis not present

## 2023-08-19 DIAGNOSIS — Z1231 Encounter for screening mammogram for malignant neoplasm of breast: Secondary | ICD-10-CM | POA: Diagnosis not present

## 2023-08-19 LAB — HM MAMMOGRAPHY

## 2023-08-31 ENCOUNTER — Encounter: Payer: Self-pay | Admitting: Family Medicine

## 2023-09-07 DIAGNOSIS — M810 Age-related osteoporosis without current pathological fracture: Secondary | ICD-10-CM | POA: Diagnosis not present

## 2023-09-07 DIAGNOSIS — N182 Chronic kidney disease, stage 2 (mild): Secondary | ICD-10-CM | POA: Diagnosis not present

## 2023-09-07 DIAGNOSIS — Z7983 Long term (current) use of bisphosphonates: Secondary | ICD-10-CM | POA: Diagnosis not present

## 2023-09-20 ENCOUNTER — Other Ambulatory Visit: Payer: Medicare PPO

## 2023-09-20 DIAGNOSIS — T452X1D Poisoning by vitamins, accidental (unintentional), subsequent encounter: Secondary | ICD-10-CM | POA: Diagnosis not present

## 2023-09-20 DIAGNOSIS — N1831 Chronic kidney disease, stage 3a: Secondary | ICD-10-CM | POA: Diagnosis not present

## 2023-09-20 DIAGNOSIS — E559 Vitamin D deficiency, unspecified: Secondary | ICD-10-CM | POA: Diagnosis not present

## 2023-09-20 DIAGNOSIS — R031 Nonspecific low blood-pressure reading: Secondary | ICD-10-CM | POA: Diagnosis not present

## 2023-09-20 DIAGNOSIS — N200 Calculus of kidney: Secondary | ICD-10-CM | POA: Diagnosis not present

## 2023-09-20 DIAGNOSIS — N189 Chronic kidney disease, unspecified: Secondary | ICD-10-CM | POA: Diagnosis not present

## 2023-10-06 DIAGNOSIS — R031 Nonspecific low blood-pressure reading: Secondary | ICD-10-CM | POA: Diagnosis not present

## 2023-10-06 DIAGNOSIS — N1831 Chronic kidney disease, stage 3a: Secondary | ICD-10-CM | POA: Diagnosis not present

## 2023-10-06 DIAGNOSIS — T452X1D Poisoning by vitamins, accidental (unintentional), subsequent encounter: Secondary | ICD-10-CM | POA: Diagnosis not present

## 2023-10-06 DIAGNOSIS — N200 Calculus of kidney: Secondary | ICD-10-CM | POA: Diagnosis not present

## 2023-11-09 DIAGNOSIS — H43391 Other vitreous opacities, right eye: Secondary | ICD-10-CM | POA: Diagnosis not present

## 2023-11-09 DIAGNOSIS — H40023 Open angle with borderline findings, high risk, bilateral: Secondary | ICD-10-CM | POA: Diagnosis not present

## 2023-11-09 DIAGNOSIS — H524 Presbyopia: Secondary | ICD-10-CM | POA: Diagnosis not present

## 2023-11-09 DIAGNOSIS — H52223 Regular astigmatism, bilateral: Secondary | ICD-10-CM | POA: Diagnosis not present

## 2023-11-09 DIAGNOSIS — H5213 Myopia, bilateral: Secondary | ICD-10-CM | POA: Diagnosis not present

## 2023-12-13 ENCOUNTER — Encounter: Payer: Self-pay | Admitting: Orthopedic Surgery

## 2023-12-13 ENCOUNTER — Ambulatory Visit: Payer: Medicare PPO | Admitting: Orthopedic Surgery

## 2023-12-13 VITALS — BP 138/88 | HR 66 | Ht 61.0 in | Wt 120.0 lb

## 2023-12-13 DIAGNOSIS — S76312A Strain of muscle, fascia and tendon of the posterior muscle group at thigh level, left thigh, initial encounter: Secondary | ICD-10-CM

## 2023-12-13 DIAGNOSIS — M79604 Pain in right leg: Secondary | ICD-10-CM

## 2023-12-13 NOTE — Progress Notes (Signed)
Patient: Joanne Sullivan           Date of Birth: 1951-10-25           MRN: [id number] Visit Date: 12/13/2023 Requested by: Raliegh Ip, DO [address] [place],  [place] [address] PCP: Raliegh Ip, DO   Chief Complaint  Patient presents with   Leg Pain    Right leg from buttock to ankle states no back pain    Encounter Diagnosis  Name Primary?   Hamstring strain, left, initial encounter Yes    Plan:  Heating pad, slight activity modification, ibuprofen  Chief Complaint  Patient presents with   Leg Pain    Right leg from buttock to ankle states no back pain     73 year old female active in multiple sports including football injured her right leg while facedown extending her right leg.  Complained of acute pain in the hamstring which later progressed to include pain down the right leg.  Symptoms have resolved somewhat and she has been able to continue activity without taking a break  Leg Pain     Body mass index is 22.67 kg/m.   Problem list, medical hx, medications and allergies reviewed   Review of Systems  Musculoskeletal:        Denies back pain     No Known Allergies  BP 138/88   Pulse 66   Ht 5\' 1"  (1.549 m)   Wt 120 lb (54.4 kg)   BMI 22.67 kg/m    Physical exam: Physical Exam Vitals and nursing note reviewed.  Constitutional:      Appearance: Normal appearance.  HENT:     Head: Normocephalic and atraumatic.  Eyes:     General: No scleral icterus.       Right eye: No discharge.        Left eye: No discharge.     Extraocular Movements: Extraocular movements intact.     Conjunctiva/sclera: Conjunctivae normal.     Pupils: Pupils are equal, round, and reactive to light.  Cardiovascular:     Rate and Rhythm: Normal rate.     Pulses: Normal pulses.  Skin:    General: Skin is warm and dry.     Capillary Refill: Capillary refill takes less than 2 seconds.  Neurological:     General: No focal deficit present.     Mental  Status: She is alert and oriented to person, place, and time.  Psychiatric:        Mood and Affect: Mood normal.        Behavior: Behavior normal.        Thought Content: Thought content normal.        Judgment: Judgment normal.     Right Hip Exam   Comments:  Excellent flexibility with 0 to 5 degree popliteal angle  Tenderness over the proximal aspect of the lateral hamstring from approximately mid thigh up to the ischial tuberosity       Gait remains normal Data reviewed:   Image(s) reviewed with personal interpretation:  No imaging necessary  Assessment and plan:  Encounter Diagnosis  Name Primary?   Hamstring strain, left, initial encounter Yes    Probable hamstring injury which is resolving.  Probably had some swelling around the injury which caused some sciatic nerve irritation.  This is resolving.  Continue heating pad, massage gun activity modification   No orders of the defined types were placed in this encounter.   Procedures:   No procedure performed

## 2023-12-13 NOTE — Progress Notes (Signed)
  Intake history:Right leg from buttock to ankle states no back pain   Ht 5\' 1"  (1.549 m)   Wt 120 lb (54.4 kg)   BMI 22.67 kg/m  Body mass index is 22.67 kg/m.      How long has this bothered you? (DOI?DOS?WS?)  Since exercise class 4 weeks ago   Anticoag.  No  Diabetes No  Heart disease No  Hypertension No  SMOKING HX No  Kidney disease Yes  Any ALLERGIES ______________________________________________   Treatment:  Have you taken:  Tylenol Yes  Advil No  Had PT No  Had injection No  Other  _________________________

## 2024-03-15 ENCOUNTER — Other Ambulatory Visit

## 2024-03-15 DIAGNOSIS — N189 Chronic kidney disease, unspecified: Secondary | ICD-10-CM | POA: Diagnosis not present

## 2024-03-15 DIAGNOSIS — N200 Calculus of kidney: Secondary | ICD-10-CM | POA: Diagnosis not present

## 2024-03-15 DIAGNOSIS — T452X1D Poisoning by vitamins, accidental (unintentional), subsequent encounter: Secondary | ICD-10-CM | POA: Diagnosis not present

## 2024-03-15 DIAGNOSIS — N1831 Chronic kidney disease, stage 3a: Secondary | ICD-10-CM | POA: Diagnosis not present

## 2024-03-15 DIAGNOSIS — R031 Nonspecific low blood-pressure reading: Secondary | ICD-10-CM | POA: Diagnosis not present

## 2024-03-15 DIAGNOSIS — E559 Vitamin D deficiency, unspecified: Secondary | ICD-10-CM | POA: Diagnosis not present

## 2024-04-04 DIAGNOSIS — N182 Chronic kidney disease, stage 2 (mild): Secondary | ICD-10-CM | POA: Diagnosis not present

## 2024-04-04 DIAGNOSIS — T452X1D Poisoning by vitamins, accidental (unintentional), subsequent encounter: Secondary | ICD-10-CM | POA: Diagnosis not present

## 2024-04-04 DIAGNOSIS — N200 Calculus of kidney: Secondary | ICD-10-CM | POA: Diagnosis not present

## 2024-04-27 ENCOUNTER — Emergency Department (HOSPITAL_COMMUNITY)

## 2024-04-27 ENCOUNTER — Emergency Department (HOSPITAL_COMMUNITY): Admitting: Certified Registered Nurse Anesthetist

## 2024-04-27 ENCOUNTER — Encounter (HOSPITAL_COMMUNITY): Payer: Self-pay

## 2024-04-27 ENCOUNTER — Encounter (HOSPITAL_COMMUNITY)
Admission: EM | Disposition: A | Discharge status: Home / Self Care | Payer: Self-pay | Source: Home / Self Care | Attending: Emergency Medicine

## 2024-04-27 ENCOUNTER — Other Ambulatory Visit: Payer: Self-pay

## 2024-04-27 ENCOUNTER — Emergency Department (HOSPITAL_COMMUNITY)
Admission: EM | Admit: 2024-04-27 | Discharge: 2024-04-27 | Disposition: A | Discharge status: Home / Self Care | Attending: Emergency Medicine | Admitting: Emergency Medicine

## 2024-04-27 DIAGNOSIS — E871 Hypo-osmolality and hyponatremia: Secondary | ICD-10-CM | POA: Insufficient documentation

## 2024-04-27 DIAGNOSIS — R739 Hyperglycemia, unspecified: Secondary | ICD-10-CM

## 2024-04-27 DIAGNOSIS — K353 Acute appendicitis with localized peritonitis, without perforation or gangrene: Secondary | ICD-10-CM | POA: Diagnosis not present

## 2024-04-27 DIAGNOSIS — K3580 Unspecified acute appendicitis: Secondary | ICD-10-CM

## 2024-04-27 DIAGNOSIS — R1031 Right lower quadrant pain: Secondary | ICD-10-CM | POA: Diagnosis not present

## 2024-04-27 DIAGNOSIS — R1084 Generalized abdominal pain: Secondary | ICD-10-CM | POA: Diagnosis present

## 2024-04-27 DIAGNOSIS — R7309 Other abnormal glucose: Secondary | ICD-10-CM | POA: Insufficient documentation

## 2024-04-27 HISTORY — PX: XI ROBOTIC LAPAROSCOPIC ASSISTED APPENDECTOMY: SHX6877

## 2024-04-27 HISTORY — DX: Chronic kidney disease, unspecified: N18.9

## 2024-04-27 HISTORY — DX: Other specified postprocedural states: R11.2

## 2024-04-27 LAB — COMPREHENSIVE METABOLIC PANEL WITH GFR
ALT: 28 U/L (ref 0–44)
AST: 29 U/L (ref 15–41)
Albumin: 4 g/dL (ref 3.5–5.0)
Alkaline Phosphatase: 81 U/L (ref 38–126)
Anion gap: 8 (ref 5–15)
BUN: 17 mg/dL (ref 8–23)
CO2: 25 mmol/L (ref 22–32)
Calcium: 9 mg/dL (ref 8.9–10.3)
Chloride: 101 mmol/L (ref 98–111)
Creatinine, Ser: 0.9 mg/dL (ref 0.44–1.00)
GFR, Estimated: >60 mL/min (ref >60)
Glucose, Bld: 127 mg/dL — ABNORMAL HIGH (ref 70–99)
Potassium: 3.9 mmol/L (ref 3.5–5.1)
Sodium: 134 mmol/L — ABNORMAL LOW (ref 135–145)
Total Bilirubin: 0.9 mg/dL (ref 0.0–1.2)
Total Protein: 7.3 g/dL (ref 6.5–8.1)

## 2024-04-27 LAB — CBC WITH DIFFERENTIAL/PLATELET
Abs Immature Granulocytes: 0.03 10*3/uL (ref 0.00–0.07)
Basophils Absolute: 0 10*3/uL (ref 0.0–0.1)
Basophils Relative: 0 %
Eosinophils Absolute: 0 10*3/uL (ref 0.0–0.5)
Eosinophils Relative: 0 %
HCT: 41.7 % (ref 36.0–46.0)
Hemoglobin: 13.9 g/dL (ref 12.0–15.0)
Immature Granulocytes: 0 %
Lymphocytes Relative: 7 %
Lymphs Abs: 0.6 10*3/uL — ABNORMAL LOW (ref 0.7–4.0)
MCH: 30.8 pg (ref 26.0–34.0)
MCHC: 33.3 g/dL (ref 30.0–36.0)
MCV: 92.3 fL (ref 80.0–100.0)
Monocytes Absolute: 0.5 10*3/uL (ref 0.1–1.0)
Monocytes Relative: 5 %
Neutro Abs: 8.1 10*3/uL — ABNORMAL HIGH (ref 1.7–7.7)
Neutrophils Relative %: 88 %
Platelets: 193 10*3/uL (ref 150–400)
RBC: 4.52 MIL/uL (ref 3.87–5.11)
RDW: 12.4 % (ref 11.5–15.5)
WBC: 9.3 10*3/uL (ref 4.0–10.5)
nRBC: 0 % (ref 0.0–0.2)

## 2024-04-27 LAB — URINALYSIS, ROUTINE W REFLEX MICROSCOPIC
Bilirubin Urine: NEGATIVE
Glucose, UA: NEGATIVE mg/dL
Hgb urine dipstick: NEGATIVE
Ketones, ur: 20 mg/dL — AB
Leukocytes,Ua: NEGATIVE
Nitrite: NEGATIVE
Protein, ur: NEGATIVE mg/dL
Specific Gravity, Urine: 1.023 (ref 1.005–1.030)
pH: 7 (ref 5.0–8.0)

## 2024-04-27 LAB — LIPASE, BLOOD: Lipase: 49 U/L (ref 11–51)

## 2024-04-27 SURGERY — APPENDECTOMY, ROBOT-ASSISTED, LAPAROSCOPIC
Anesthesia: General | Site: Abdomen

## 2024-04-27 MED ORDER — BUPIVACAINE HCL (PF) 0.5 % IJ SOLN
INTRAMUSCULAR | Status: DC | PRN
  Administered 2024-04-27: 20 mL

## 2024-04-27 MED ORDER — FENTANYL CITRATE (PF) 100 MCG/2ML IJ SOLN
INTRAMUSCULAR | Status: AC
Start: 2024-04-27 — End: 2024-04-27
  Filled 2024-04-27: qty 2

## 2024-04-27 MED ORDER — CHLORHEXIDINE GLUCONATE CLOTH 2 % EX PADS: 6.0000 | MEDICATED_PAD | Freq: Once | CUTANEOUS | Status: DC

## 2024-04-27 MED ORDER — FENTANYL CITRATE (PF) 100 MCG/2ML IJ SOLN
INTRAMUSCULAR | Status: DC | PRN
  Administered 2024-04-27 (×2): 50 ug via INTRAVENOUS

## 2024-04-27 MED ORDER — OXYCODONE HCL 5 MG PO TABS
5.0000 mg | ORAL_TABLET | Freq: Once | ORAL | Status: AC | PRN
  Administered 2024-04-27: 5 mg via ORAL
  Filled 2024-04-27: qty 1

## 2024-04-27 MED ORDER — ONDANSETRON HCL 4 MG/2ML IJ SOLN
4.0000 mg | Freq: Once | INTRAMUSCULAR | Status: AC
  Administered 2024-04-27: 4 mg via INTRAVENOUS
  Filled 2024-04-27: qty 2

## 2024-04-27 MED ORDER — DEXAMETHASONE SODIUM PHOSPHATE 4 MG/ML IJ SOLN: 8.0000 mg | Freq: Once | INTRAMUSCULAR | Status: DC | PRN

## 2024-04-27 MED ORDER — OXYCODONE HCL 5 MG/5ML PO SOLN: 5.0000 mg | Freq: Once | ORAL | Status: AC | PRN

## 2024-04-27 MED ORDER — ONDANSETRON HCL 4 MG PO TABS: 4.0000 mg | ORAL_TABLET | Freq: Three times a day (TID) | ORAL | 1 refills | Status: DC | PRN

## 2024-04-27 MED ORDER — PHENYLEPHRINE 80 MCG/ML (10ML) SYRINGE FOR IV PUSH (FOR BLOOD PRESSURE SUPPORT)
PREFILLED_SYRINGE | INTRAVENOUS | Status: AC
  Filled 2024-04-27: qty 10

## 2024-04-27 MED ORDER — ROCURONIUM BROMIDE 10 MG/ML (PF) SYRINGE
PREFILLED_SYRINGE | INTRAVENOUS | Status: DC | PRN
  Administered 2024-04-27: 40 mg via INTRAVENOUS

## 2024-04-27 MED ORDER — CHLORHEXIDINE GLUCONATE 0.12 % MT SOLN: 15.0000 mL | Freq: Once | OROMUCOSAL | Status: AC

## 2024-04-27 MED ORDER — LIDOCAINE 2% (20 MG/ML) 5 ML SYRINGE
INTRAMUSCULAR | Status: AC
  Filled 2024-04-27: qty 15

## 2024-04-27 MED ORDER — PROPOFOL 10 MG/ML IV BOLUS
INTRAVENOUS | Status: DC | PRN
  Administered 2024-04-27: 100 mg via INTRAVENOUS

## 2024-04-27 MED ORDER — DEXAMETHASONE SODIUM PHOSPHATE 10 MG/ML IJ SOLN
INTRAMUSCULAR | Status: DC | PRN
  Administered 2024-04-27: 10 mg via INTRAVENOUS

## 2024-04-27 MED ORDER — MORPHINE SULFATE (PF) 2 MG/ML IV SOLN: 2.0000 mg | INTRAVENOUS | Status: DC | PRN

## 2024-04-27 MED ORDER — SCOPOLAMINE 1 MG/3DAYS TD PT72
MEDICATED_PATCH | TRANSDERMAL | Status: AC
  Filled 2024-04-27: qty 1

## 2024-04-27 MED ORDER — SUGAMMADEX SODIUM 200 MG/2ML IV SOLN
INTRAVENOUS | Status: DC | PRN
  Administered 2024-04-27: 200 mg via INTRAVENOUS

## 2024-04-27 MED ORDER — IOHEXOL 300 MG/ML  SOLN
100.0000 mL | Freq: Once | INTRAMUSCULAR | Status: AC | PRN
  Administered 2024-04-27: 100 mL via INTRAVENOUS

## 2024-04-27 MED ORDER — DEXAMETHASONE SODIUM PHOSPHATE 10 MG/ML IJ SOLN
INTRAMUSCULAR | Status: AC
Start: 2024-04-27 — End: 2024-04-27
  Filled 2024-04-27: qty 1

## 2024-04-27 MED ORDER — ROCURONIUM BROMIDE 10 MG/ML (PF) SYRINGE
PREFILLED_SYRINGE | INTRAVENOUS | Status: AC
  Filled 2024-04-27: qty 10

## 2024-04-27 MED ORDER — SCOPOLAMINE 1 MG/3DAYS TD PT72
1.0000 | MEDICATED_PATCH | Freq: Once | TRANSDERMAL | Status: DC
  Administered 2024-04-27: 1.5 mg via TRANSDERMAL

## 2024-04-27 MED ORDER — ONDANSETRON HCL 4 MG/2ML IJ SOLN
INTRAMUSCULAR | Status: AC
  Filled 2024-04-27: qty 4

## 2024-04-27 MED ORDER — STERILE WATER FOR IRRIGATION IR SOLN
Status: DC | PRN
  Administered 2024-04-27: 1000 mL

## 2024-04-27 MED ORDER — LACTATED RINGERS IV SOLN: INTRAVENOUS | Status: DC

## 2024-04-27 MED ORDER — PHENYLEPHRINE 80 MCG/ML (10ML) SYRINGE FOR IV PUSH (FOR BLOOD PRESSURE SUPPORT)
PREFILLED_SYRINGE | INTRAVENOUS | Status: DC | PRN
  Administered 2024-04-27: 160 ug via INTRAVENOUS
  Administered 2024-04-27 (×2): 80 ug via INTRAVENOUS

## 2024-04-27 MED ORDER — SODIUM CHLORIDE 0.9 % IV SOLN
2.0000 g | INTRAVENOUS | Status: AC
  Administered 2024-04-27: 2 g via INTRAVENOUS
  Filled 2024-04-27: qty 2

## 2024-04-27 MED ORDER — CHLORHEXIDINE GLUCONATE 0.12 % MT SOLN
OROMUCOSAL | Status: AC
  Administered 2024-04-27: 15 mL via OROMUCOSAL
  Filled 2024-04-27: qty 15

## 2024-04-27 MED ORDER — ONDANSETRON HCL 4 MG/2ML IJ SOLN
INTRAMUSCULAR | Status: DC | PRN
  Administered 2024-04-27: 4 mg via INTRAVENOUS

## 2024-04-27 MED ORDER — SUCCINYLCHOLINE CHLORIDE 200 MG/10ML IV SOSY
PREFILLED_SYRINGE | INTRAVENOUS | Status: DC | PRN
  Administered 2024-04-27: 100 mg via INTRAVENOUS

## 2024-04-27 MED ORDER — OXYCODONE HCL 5 MG PO TABS: 5.0000 mg | ORAL_TABLET | ORAL | 0 refills | Status: DC | PRN

## 2024-04-27 MED ORDER — SUCCINYLCHOLINE CHLORIDE 200 MG/10ML IV SOSY
PREFILLED_SYRINGE | INTRAVENOUS | Status: AC
  Filled 2024-04-27: qty 10

## 2024-04-27 MED ORDER — MORPHINE SULFATE (PF) 4 MG/ML IV SOLN
4.0000 mg | Freq: Once | INTRAVENOUS | Status: AC
  Administered 2024-04-27: 4 mg via INTRAVENOUS
  Filled 2024-04-27: qty 1

## 2024-04-27 MED ORDER — FENTANYL CITRATE PF 50 MCG/ML IJ SOSY: 25.0000 ug | PREFILLED_SYRINGE | INTRAMUSCULAR | Status: DC | PRN

## 2024-04-27 MED ORDER — ORAL CARE MOUTH RINSE: 15.0000 mL | Freq: Once | OROMUCOSAL | Status: AC

## 2024-04-27 MED ORDER — LIDOCAINE 2% (20 MG/ML) 5 ML SYRINGE
INTRAMUSCULAR | Status: DC | PRN
  Administered 2024-04-27: 40 mg via INTRAVENOUS

## 2024-04-27 MED ORDER — METRONIDAZOLE 500 MG/100ML IV SOLN
500.0000 mg | Freq: Once | INTRAVENOUS | Status: AC
  Administered 2024-04-27: 500 mg via INTRAVENOUS
  Filled 2024-04-27: qty 100

## 2024-04-27 MED ORDER — SODIUM CHLORIDE 0.9 % IV SOLN
2.0000 g | Freq: Once | INTRAVENOUS | Status: AC
  Administered 2024-04-27: 2 g via INTRAVENOUS
  Filled 2024-04-27: qty 20

## 2024-04-27 SURGICAL SUPPLY — 42 items
BLADE SURG 15 STRL LF DISP TIS (BLADE) ×1 IMPLANT
CHLORAPREP W/TINT 26 (MISCELLANEOUS) ×1 IMPLANT
COVER LIGHT HANDLE (MISCELLANEOUS) IMPLANT
COVER TIP SHEARS 8 DVNC (MISCELLANEOUS) ×1 IMPLANT
DERMABOND ADVANCED .7 DNX12 (GAUZE/BANDAGES/DRESSINGS) ×1 IMPLANT
DRAPE ARM DVNC X/XI (DISPOSABLE) ×3 IMPLANT
DRAPE COLUMN DVNC XI (DISPOSABLE) ×1 IMPLANT
DRIVER NDL MEGA 8 DVNC XI (INSTRUMENTS) IMPLANT
DRIVER NDLE MEGA DVNC XI (INSTRUMENTS) ×1 IMPLANT
ELECTRODE REM PT RTRN 9FT ADLT (ELECTROSURGICAL) ×1 IMPLANT
GAUZE 4X4 16PLY ~~LOC~~+RFID DBL (SPONGE) ×1 IMPLANT
GLOVE BIO SURGEON STRL SZ 6.5 (GLOVE) ×2 IMPLANT
GLOVE BIOGEL PI IND STRL 6.5 (GLOVE) ×2 IMPLANT
GLOVE BIOGEL PI IND STRL 7.0 (GLOVE) ×2 IMPLANT
GOWN STRL REUS W/TWL LRG LVL3 (GOWN DISPOSABLE) ×3 IMPLANT
KIT PINK PAD W/HEAD ARM REST (MISCELLANEOUS) ×1 IMPLANT
KIT TURNOVER KIT A (KITS) ×1 IMPLANT
MANIFOLD NEPTUNE II (INSTRUMENTS) ×1 IMPLANT
NDL HYPO 21X1.5 SAFETY (NEEDLE) ×1 IMPLANT
NDL INSUFFLATION 14GA 120MM (NEEDLE) ×1 IMPLANT
NEEDLE HYPO 21X1.5 SAFETY (NEEDLE) ×1 IMPLANT
NEEDLE INSUFFLATION 14GA 120MM (NEEDLE) ×1 IMPLANT
OBTURATOR OPTICALSTD 8 DVNC (TROCAR) ×1 IMPLANT
PACK LAP CHOLE LZT030E (CUSTOM PROCEDURE TRAY) ×1 IMPLANT
PAD ARMBOARD POSITIONER FOAM (MISCELLANEOUS) ×1 IMPLANT
PENCIL ELECTRO HAND CTR (MISCELLANEOUS) ×1 IMPLANT
PENCIL HANDSWITCHING (ELECTRODE) IMPLANT
RELOAD STAPLE 30 2.5 WHT DVNC (STAPLE) ×1 IMPLANT
SEAL UNIV 5-12 XI (MISCELLANEOUS) ×3 IMPLANT
SEALER VESSEL EXT DVNC XI (MISCELLANEOUS) IMPLANT
SET BASIN LINEN APH (SET/KITS/TRAYS/PACK) ×1 IMPLANT
SET TUBE SMOKE EVAC HIGH FLOW (TUBING) ×1 IMPLANT
STAPLER 30 CRVD 8 SUREFORM (STAPLE) ×1 IMPLANT
STAPLER RELOAD 30 WHITE 8 (STAPLE) ×1 IMPLANT
SUT MNCRL AB 4-0 PS2 18 (SUTURE) ×2 IMPLANT
SUT VIC AB 3-0 SH 27X BRD (SUTURE) IMPLANT
SUT VICRYL 0 UR6 27IN ABS (SUTURE) ×1 IMPLANT
SYR 30ML LL (SYRINGE) ×1 IMPLANT
SYSTEM BAG RETRIEVAL 10MM (BASKET) IMPLANT
TAPE TRANSPORE STRL 2 31045 (GAUZE/BANDAGES/DRESSINGS) ×1 IMPLANT
TRAY FOLEY MTR SLVR 16FR STAT (SET/KITS/TRAYS/PACK) ×1 IMPLANT
WATER STERILE IRR 500ML POUR (IV SOLUTION) ×1 IMPLANT

## 2024-04-27 NOTE — Op Note (Addendum)
 [facility]  04/27/24  Preoperative diagnosis: Acute appendicitis  Postoperative diagnosis: Same  Procedure: Robotic assisted laparoscopic appendectomy  Anesthesia: General   Surgeon: Manuelita Pander, MD   Wound Classification: Contaminated   Specimen: Appendix  Complications: None  Estimated Blood Loss: minimal    Indications: Patient is a 73 y.o. female  presented with acute appendicitis on imaging. The risk of surgery were explained to the patient including but not limited to bleeding, infection, finding a rupture, injury to other organs, needing to do an open procedure.   FIndings: Enlarged dilated appendix  Upon entering the abdomen (organ space), I encountered infection of the appendix with  dilation and appendiceal inflammation.   Description of procedure: The patient was placed on the operating table in the supine position, left arm tucked. General anesthesia was induced. A time-out was completed verifying correct patient, procedure, site, positioning, and implant(s) and/or special equipment prior to beginning this procedure. A foley and OG were placed. The abdomen was prepped and draped in the usual sterile fashion.   In Palmer's point an incision was made and Veress needle was inserted. After confirming intraabdominal location with positive saline drop test, the gas was attached. Initially I was not intraperitoneal and some gas went under the rectus muscle.  I then reinserted the Veress, and verified with the saline drop test and and low insufflation pressures, gas insufflation was initiated until the abdominal pressure was measured at 15 mmHg.  Afterwards, the Veress needle was removed and a 8 mm port was placed in the left mid lateral abdomen using Optiview technique. No injuries were noted. Two additional incision was made 4-8 cm apart each side along the left side of the abdominal wall from the initial incision.  An 8 mm port in the left lower abdomen  and the left upper abdomen extending Palmer's point incision, both were placed under direct visualization.  No injuries from trocar placements were noted. The table was placed in the Trendelenburg position with the right side elevated.  The robotic platform was then brought to the operative field and docked. A vessel sealer was placed through the upper left abdomen 8 mm port and a forced bipolar through the lower 8 mm port.   Upon entering the abdomen (organ space), I encountered infection of the appendiceal with appendiceal inflammation. An appendix that appeared dilated and enlarged was identified and elevated.  Window created at base of appendix in the mesentery.  A white load linear cutting stapler was placed through the upper left 8 mm port, and then used to divide and staple the base of the appendix. The vessel sealer was used to ligate the mesoappendix. No bleeding from the staple lines noted.  There was no signs of purulence or rupture of the appendix.   The appendiceal staple line and mesoappendix ligation was examined again and hemostasis noted. On examining the abdomen, I noted a small area of bleeding on the anterior stomach. I investigated this as this was not evident on the initial port placement, but was in the vicinity of the Veress placement. I did not see any obvious injury or defect but a small pinpoint area of bleeding. To be safe, given risk of a small serosal injury to the stomach from the Veress, I opted to use a 3-0 Vicryl suture and needle driver, to do a Lembert suture over the area and buttressed this with omental fat to protect the area.  No other pathology was identified within pelvis.  The appendix was  placed in an endoscopic retrieval bag and removed through the 8 mm port upper port site. Remaining trocars were removed. No bleeding was noted.The abdomen was allowed to collapse via smoke evacuation. Marcaine was injected. All skin incisions then closed with subcuticular sutures  Monocryl 4-0.  Wounds then dressed with dermabond.   The patient tolerated the procedure well, awakened from anesthesia and was taken to the postanesthesia care unit in satisfactory condition.  The foley and OG were removed. Sponge count and instrument count correct at the end of the procedure.  Manuelita Pander, MD [facility] [address] [place], [place] [address] [phone] (office)

## 2024-04-27 NOTE — ED Provider Notes (Signed)
 [place] EMERGENCY DEPARTMENT AT [facility] Provider Note   CSN: [id number] Arrival date & time: 04/27/24  9466     Patient presents with: Abdominal Pain   Joanne Sullivan is a 73 y.o. female.   The history is provided by the patient.  Abdominal Pain She has no significant history and comes in complaining of diffuse abdominal pain which started about 6:30 PM.  Pain does radiate to the back.  There is associated nausea and vomiting.  Pain is not improved following emesis.  She has had a bowel movement and passed flatus which does not affect the pain.  She has never had pain like this before but does relate and renal ultrasound had shown a kidney stone in the past.   Prior to Admission medications   Medication Sig Start Date End Date Taking? Authorizing Provider  Biotin 10 MG TABS Take by mouth.    [provider]  risedronate  (ACTONEL ) 150 MG tablet Take 1 tablet (150 mg total) by mouth every 30 (thirty) days. with water on empty stomach, nothing by mouth or lie down for next 30 minutes. 06/19/23   Jolinda Norene HERO, DO    Allergies: Patient has no known allergies.    Review of Systems  Gastrointestinal:  Positive for abdominal pain.  All other systems reviewed and are negative.   Updated Vital Signs BP 126/71   Pulse 61   Temp 97.6 F (36.4 C)   Resp 18   Ht 5' 1.5 (1.562 m)   Wt 53.5 kg   SpO2 100%   BMI 21.93 kg/m   Physical Exam Vitals and nursing note reviewed.   73 year old female, resting comfortably and in no acute distress. Vital signs are normal. Oxygen saturation is 98%, which is normal. Head is normocephalic and atraumatic. PERRLA, EOMI.  Neck is nontender and supple without adenopathy. Back is nontender and there is no CVA tenderness. Lungs are clear without rales, wheezes, or rhonchi. Chest is nontender. Heart has regular rate and rhythm without murmur. Abdomen is soft, flat, with mild tender diffusely.  Maximum tenderness is  in the right lower quadrant.  There is no rebound or guarding. Extremities have no cyanosis or edema, full range of motion is present. Skin is warm and dry without rash. Neurologic: Awake and alert, moves all extremities equally.  (all labs ordered are listed, but only abnormal results are displayed) Labs Reviewed  COMPREHENSIVE METABOLIC PANEL WITH GFR - Abnormal; Notable for the following components:      Result Value   Sodium 134 (*)    Glucose, Bld 127 (*)    All other components within normal limits  CBC WITH DIFFERENTIAL/PLATELET - Abnormal; Notable for the following components:   Neutro Abs 8.1 (*)    Lymphs Abs 0.6 (*)    All other components within normal limits  LIPASE, BLOOD  URINALYSIS, ROUTINE W REFLEX MICROSCOPIC    Radiology: CT ABDOMEN PELVIS W CONTRAST Result Date: 04/27/2024 CLINICAL DATA:  Right lower quadrant abdominal pain.  Vomiting. EXAM: CT ABDOMEN AND PELVIS WITH CONTRAST TECHNIQUE: Multidetector CT imaging of the abdomen and pelvis was performed using the standard protocol following bolus administration of intravenous contrast. RADIATION DOSE REDUCTION: This exam was performed according to the departmental dose-optimization program which includes automated exposure control, adjustment of the mA and/or kV according to patient size and/or use of iterative reconstruction technique. CONTRAST:  100mL OMNIPAQUE IOHEXOL 300 MG/ML  SOLN COMPARISON:  None Available. FINDINGS: Lower chest: 10 mm  left lower lobe nodule with central dense calcification is consistent with a granuloma. Hepatobiliary: No suspicious focal abnormality within the liver parenchyma. There is no evidence for gallstones, gallbladder wall thickening, or pericholecystic fluid. No intrahepatic or extrahepatic biliary dilation. Pancreas: No focal mass lesion. No dilatation of the main duct. No intraparenchymal cyst. No peripancreatic edema. Spleen: No splenomegaly. No suspicious focal mass lesion.  Adrenals/Urinary Tract: No adrenal nodule or mass. Tiny well-defined homogeneous low-density lesions in both kidneys are too small to characterize but are statistically most likely benign and probably cysts. No followup imaging is recommended. No evidence for hydroureter. The urinary bladder appears normal for the degree of distention. Stomach/Bowel: Stomach is unremarkable. No gastric wall thickening. No evidence of outlet obstruction. Duodenum is normally positioned as is the ligament of Treitz. No small bowel wall thickening. No small bowel dilatation. The terminal ileum is normal. The appendix is dilated up to 13 mm diameter with ill-defined wall and periappendiceal edema/inflammation. Several appendicoliths are evident measuring up to about 12 mm in the mid appendix. No extraluminal gas or rim enhancing abscess in the right lower quadrant. No gross colonic mass. No colonic wall thickening. Vascular/Lymphatic: There is mild atherosclerotic calcification of the abdominal aorta without aneurysm. There is no gastrohepatic or hepatoduodenal ligament lymphadenopathy. No retroperitoneal or mesenteric lymphadenopathy. No pelvic sidewall lymphadenopathy. Reproductive: There is no adnexal mass. Other: No substantial intraperitoneal free fluid. Musculoskeletal: No worrisome lytic or sclerotic osseous abnormality. IMPRESSION: 1. Acute appendicitis with several appendicoliths measuring up to about 12 mm in the mid appendix. No extraluminal gas or rim enhancing abscess in the right lower quadrant. 2.  Aortic Atherosclerosis (ICD10-I70.0). Electronically Signed   By: Camellia Candle M.D.   On: 04/27/2024 07:03     Procedures   Medications Ordered in the ED  cefTRIAXone (ROCEPHIN) 2 g in sodium chloride 0.9 % 100 mL IVPB (has no administration in time range)  metroNIDAZOLE (FLAGYL) IVPB 500 mg (has no administration in time range)  morphine (PF) 4 MG/ML injection 4 mg (4 mg Intravenous Given 04/27/24 0603)  ondansetron  (ZOFRAN) injection 4 mg (4 mg Intravenous Given 04/27/24 0602)  iohexol (OMNIPAQUE) 300 MG/ML solution 100 mL (100 mLs Intravenous Contrast Given 04/27/24 0641)                                    Medical Decision Making Amount and/or Complexity of Data Reviewed Labs: ordered. Radiology: ordered.  Risk Prescription drug management. Decision regarding hospitalization.   Abdominal pain which seems to localize to the right lower quadrant.  This is a presentation which has a wide range of treatment options and carries with it a high risk of morbidity and complications.  Differential diagnosis includes, but is not limited to, appendicitis, pancreatitis, cholecystitis, urolithiasis, pyelonephritis, diverticulitis, ovarian cyst.  I have reviewed her past records, and do note renal ultrasound on 01/20/2022 showing a 3 mm nonobstructive right renal calculus.  I have ordered morphine for pain, ondansetron for nausea and laboratory workup and CT of abdomen and pelvis.  I have reviewed her laboratory tests, my interpretation is mild hyponatremia which is not felt to be clinically significant, elevated random glucose level which will need to be followed as an outpatient, normal lipase, normal CBC but with a left shift.  CT of abdomen and pelvis shows acute appendicitis without evidence of abscess or perforation.  Have independently viewed the images, and agree with the  radiologist's interpretation.  I have ordered a dose of intravenous ceftriaxone and metronidazole.  I have discussed the case with Dr. Kallie, on-call for general surgery who agrees to admit the patient for surgical management.     Final diagnoses:  Acute appendicitis with localized peritonitis, without perforation, abscess, or gangrene  Hyponatremia  Elevated random blood glucose level    ED Discharge Orders     None          Raford Lenis, MD 04/27/24 [phone]

## 2024-04-27 NOTE — ED Notes (Signed)
Surgical team here to get pt.

## 2024-04-27 NOTE — Anesthesia Postprocedure Evaluation (Signed)
 Anesthesia Post Note  Patient: Joanne Sullivan  Procedure(s) Performed: APPENDECTOMY, ROBOT-ASSISTED, LAPAROSCOPIC (Abdomen)  Patient location during evaluation: PACU Anesthesia Type: General Level of consciousness: awake and alert Pain management: pain level controlled Vital Signs Assessment: post-procedure vital signs reviewed and stable Respiratory status: spontaneous breathing, nonlabored ventilation, respiratory function stable and patient connected to nasal cannula oxygen Cardiovascular status: blood pressure returned to baseline and stable Postop Assessment: no apparent nausea or vomiting Anesthetic complications: no   No notable events documented.   Last Vitals:  Vitals:   04/27/24 1445 04/27/24 1501  BP: 112/69 130/76  Pulse: 84 84  Resp: 12 14  Temp:  36.7 C  SpO2: 97% 100%    Last Pain:  Vitals:   04/27/24 1501  TempSrc: Oral  PainSc: 0-No pain                 Andrea Limes

## 2024-04-27 NOTE — H&P (Signed)
 [facility] History and Physical  Reason for Referral: Acute appendicitis  Referring Physician: Dr. Raford.   Chief Complaint   Abdominal Pain     Joanne Sullivan is a 73 y.o. female.  HPI:    Joanne Sullivan is a very healthy and active 73 yo who comes in with acute onset of RLQ pain and nausea/vomiting. She was worked up on the ED and found to have acute appendicitis on CT scan. The patient is here with her husband.   Past Medical History:  Diagnosis Date   Allergy    Glaucoma suspect    Heart murmur    Seasonal allergic reaction     Past Surgical History:  Procedure Laterality Date   left radial head fracture Left     Family History  Problem Relation Age of Onset   Heart disease Mother        2 MIs   Hypertension Mother    Asthma Mother    Hearing loss Mother    Stroke Mother    Heart disease Father 46       AMI   COPD Father    Cancer Sister        ovarian    Social History   Tobacco Use   Smoking status: Never   Smokeless tobacco: Never  Vaping Use   Vaping status: Never Used  Substance Use Topics   Alcohol use: No   Drug use: No    Medications: I have reviewed the patient's current medications. Current Facility-Administered Medications  Medication Dose Route Frequency Provider Last Rate Last Admin   cefoTEtan (CEFOTAN) 2 g in sodium chloride 0.9 % 100 mL IVPB  2 g Intravenous On Call to OR Kallie Joanne BROCKS, MD       Chlorhexidine Gluconate Cloth 2 % PADS 6 each  6 each Topical Once Kallie Joanne BROCKS, MD       morphine (PF) 2 MG/ML injection 2 mg  2 mg Intravenous Q2H PRN Kallie Joanne BROCKS, MD       Current Outpatient Medications  Medication Sig Dispense Refill Last Dose/Taking   Biotin 10 MG TABS Take by mouth.      risedronate  (ACTONEL ) 150 MG tablet Take 1 tablet (150 mg total) by mouth every 30 (thirty) days. with water on empty stomach, nothing by mouth or lie down for next 30 minutes. 3 tablet 4     No Known  Allergies      ROS:  A comprehensive review of systems was negative except for: Gastrointestinal: positive for abdominal pain, nausea, and vomiting  Blood pressure 134/78, pulse 63, temperature 97.8 F (36.6 C), temperature source Oral, resp. rate 18, height 5' 1.5 (1.562 m), weight 53.5 kg, SpO2 100%. Physical Exam Vitals reviewed.  HENT:     Head: Normocephalic.  Cardiovascular:     Rate and Rhythm: Normal rate.  Pulmonary:     Effort: Pulmonary effort is normal.  Abdominal:     General: There is no distension.     Palpations: Abdomen is soft.     Tenderness: There is abdominal tenderness in the right upper quadrant.  Skin:    General: Skin is warm.  Neurological:     General: No focal deficit present.     Mental Status: She is alert.      Results: Results for orders placed or performed during the hospital encounter of 04/27/24 (from the past 48 hours)  Comprehensive metabolic panel     Status: Abnormal  Collection Time: 04/27/24  5:58 AM  Result Value Ref Range   Sodium 134 (L) 135 - 145 mmol/L   Potassium 3.9 3.5 - 5.1 mmol/L   Chloride 101 98 - 111 mmol/L   CO2 25 22 - 32 mmol/L   Glucose, Bld 127 (H) 70 - 99 mg/dL    Comment: Glucose reference range applies only to samples taken after fasting for at least 8 hours.   BUN 17 8 - 23 mg/dL   Creatinine, Ser 9.09 0.44 - 1.00 mg/dL   Calcium 9.0 8.9 - 89.6 mg/dL   Total Protein 7.3 6.5 - 8.1 g/dL   Albumin 4.0 3.5 - 5.0 g/dL   AST 29 15 - 41 U/L   ALT 28 0 - 44 U/L   Alkaline Phosphatase 81 38 - 126 U/L   Total Bilirubin 0.9 0.0 - 1.2 mg/dL   GFR, Estimated >39 >39 mL/min    Comment: (NOTE) Calculated using the CKD-EPI Creatinine Equation (2021)    Anion gap 8 5 - 15    Comment: Performed at [facility], [address] [place], [place] [address]  Lipase, blood     Status: None   Collection Time: 04/27/24  5:58 AM  Result Value Ref Range   Lipase 49 11 - 51 U/L    Comment: Performed at [facility], [address] [place], [place] [address]  CBC with Differential     Status: Abnormal   Collection Time: 04/27/24  5:58 AM  Result Value Ref Range   WBC 9.3 4.0 - 10.5 K/uL   RBC 4.52 3.87 - 5.11 MIL/uL   Hemoglobin 13.9 12.0 - 15.0 g/dL   HCT 58.2 63.9 - 53.9 %   MCV 92.3 80.0 - 100.0 fL   MCH 30.8 26.0 - 34.0 pg   MCHC 33.3 30.0 - 36.0 g/dL   RDW 87.5 88.4 - 84.4 %   Platelets 193 150 - 400 K/uL   nRBC 0.0 0.0 - 0.2 %   Neutrophils Relative % 88 %   Neutro Abs 8.1 (H) 1.7 - 7.7 K/uL   Lymphocytes Relative 7 %   Lymphs Abs 0.6 (L) 0.7 - 4.0 K/uL   Monocytes Relative 5 %   Monocytes Absolute 0.5 0.1 - 1.0 K/uL   Eosinophils Relative 0 %   Eosinophils Absolute 0.0 0.0 - 0.5 K/uL   Basophils Relative 0 %   Basophils Absolute 0.0 0.0 - 0.1 K/uL   Immature Granulocytes 0 %   Abs Immature Granulocytes 0.03 0.00 - 0.07 K/uL    Comment: Performed at [facility], [address], [place], [place] [address]  Urinalysis, Routine w reflex microscopic -Urine, Clean Catch     Status: Abnormal   Collection Time: 04/27/24  7:42 AM  Result Value Ref Range   Color, Urine COLORLESS (A) YELLOW   APPearance CLEAR CLEAR   Specific Gravity, Urine 1.023 1.005 - 1.030   pH 7.0 5.0 - 8.0   Glucose, UA NEGATIVE NEGATIVE mg/dL   Hgb urine dipstick NEGATIVE NEGATIVE   Bilirubin Urine NEGATIVE NEGATIVE   Ketones, ur 20 (A) NEGATIVE mg/dL   Protein, ur NEGATIVE NEGATIVE mg/dL   Nitrite NEGATIVE NEGATIVE   Leukocytes,Ua NEGATIVE NEGATIVE    Comment: Performed at [facility], [address] [place], [place] [address]   Personally reviewed and showed patient and family- large dilated appendix with appendicolith  CT ABDOMEN PELVIS W CONTRAST Result Date: 04/27/2024 CLINICAL DATA:  Right lower quadrant abdominal pain.  Vomiting. EXAM:  CT ABDOMEN AND PELVIS WITH CONTRAST TECHNIQUE: Multidetector CT imaging of the abdomen and pelvis was performed using the standard protocol following bolus administration  of intravenous contrast. RADIATION DOSE REDUCTION: This exam was performed according to the departmental dose-optimization program which includes automated exposure control, adjustment of the mA and/or kV according to patient size and/or use of iterative reconstruction technique. CONTRAST:  100mL OMNIPAQUE IOHEXOL 300 MG/ML  SOLN COMPARISON:  None Available. FINDINGS: Lower chest: 10 mm left lower lobe nodule with central dense calcification is consistent with a granuloma. Hepatobiliary: No suspicious focal abnormality within the liver parenchyma. There is no evidence for gallstones, gallbladder wall thickening, or pericholecystic fluid. No intrahepatic or extrahepatic biliary dilation. Pancreas: No focal mass lesion. No dilatation of the main duct. No intraparenchymal cyst. No peripancreatic edema. Spleen: No splenomegaly. No suspicious focal mass lesion. Adrenals/Urinary Tract: No adrenal nodule or mass. Tiny well-defined homogeneous low-density lesions in both kidneys are too small to characterize but are statistically most likely benign and probably cysts. No followup imaging is recommended. No evidence for hydroureter. The urinary bladder appears normal for the degree of distention. Stomach/Bowel: Stomach is unremarkable. No gastric wall thickening. No evidence of outlet obstruction. Duodenum is normally positioned as is the ligament of Treitz. No small bowel wall thickening. No small bowel dilatation. The terminal ileum is normal. The appendix is dilated up to 13 mm diameter with ill-defined wall and periappendiceal edema/inflammation. Several appendicoliths are evident measuring up to about 12 mm in the mid appendix. No extraluminal gas or rim enhancing abscess in the right lower quadrant. No gross colonic mass. No colonic wall thickening. Vascular/Lymphatic: There is mild atherosclerotic calcification of the abdominal aorta without aneurysm. There is no gastrohepatic or hepatoduodenal ligament  lymphadenopathy. No retroperitoneal or mesenteric lymphadenopathy. No pelvic sidewall lymphadenopathy. Reproductive: There is no adnexal mass. Other: No substantial intraperitoneal free fluid. Musculoskeletal: No worrisome lytic or sclerotic osseous abnormality. IMPRESSION: 1. Acute appendicitis with several appendicoliths measuring up to about 12 mm in the mid appendix. No extraluminal gas or rim enhancing abscess in the right lower quadrant. 2.  Aortic Atherosclerosis (ICD10-I70.0). Electronically Signed   By: Camellia Candle M.D.   On: 04/27/2024 07:03     Assessment and Plan:  Joanne Sullivan is a 73 y.o. female with acute appendicitis without signs of rupture at this time.  -Discussed the risk of laparoscopic appendectomy and the option of antibiotics alone. Discussed that in [place] and some trials in the [place] , antibiotics are used for simple appendicitis. Discussed that research shows a 40% failure rate for antibiotics alone.  Discussed risk of surgery including but not limited to bleeding, infection, injury to other organs, normal appendix, and after this discussion the patient has decided to proceed with surgery.   All questions were answered to the satisfaction of the patient and family    Joanne Sullivan 04/27/2024, 8:39 AM

## 2024-04-27 NOTE — Transfer of Care (Signed)
 Immediate Anesthesia Transfer of Care Note  Patient: Joanne Sullivan  Procedure(s) Performed: APPENDECTOMY, ROBOT-ASSISTED, LAPAROSCOPIC (Abdomen)  Patient Location: PACU  Anesthesia Type:General  Level of Consciousness: awake  Airway & Oxygen Therapy: Patient Spontanous Breathing and Patient connected to nasal cannula oxygen  Post-op Assessment: Report given to RN and Post -op Vital signs reviewed and stable  Post vital signs: Reviewed and stable  Last Vitals:  Vitals Value Taken Time  BP 137/75 04/27/24 14:01  Temp 97.6   Pulse 92 04/27/24 14:02  Resp 10 04/27/24 14:02  SpO2 100 % 04/27/24 14:02  Vitals shown include unfiled device data.  Last Pain:  Vitals:   04/27/24 1121  TempSrc: Oral  PainSc: 7       Patients Stated Pain Goal: 6 (04/27/24 1121)  Complications: No notable events documented.

## 2024-04-27 NOTE — ED Notes (Signed)
Surgeon at bedside with pt. 

## 2024-04-27 NOTE — ED Triage Notes (Signed)
 Pov from home. Cc of abdominal and back pain that started at 6pm. Says she cannot keep anything down . Emesis x6. No diarrhea. 8/10 pain. Constant ache

## 2024-04-27 NOTE — ED Notes (Addendum)
 Per Pt last time drank a sip of water this morning  at 5:30 am and food was 5 pm Wed 04/26/24, has had several emesis since then.

## 2024-04-27 NOTE — Anesthesia Preprocedure Evaluation (Addendum)
 Anesthesia Evaluation  Patient identified by MRN, date of birth, ID band Patient awake    Reviewed: Allergy & Precautions, H&P , NPO status , Patient's Chart, lab work & pertinent test results  History of Anesthesia Complications (+) PONV and history of anesthetic complications  Airway Mallampati: II  TM Distance: >3 FB Neck ROM: Full    Dental no notable dental hx.    Pulmonary neg pulmonary ROS   Pulmonary exam normal breath sounds clear to auscultation       Cardiovascular Normal cardiovascular exam+ Valvular Problems/Murmurs  Rhythm:Regular Rate:Normal     Neuro/Psych negative neurological ROS  negative psych ROS   GI/Hepatic negative GI ROS, Neg liver ROS,,,  Endo/Other  negative endocrine ROS    Renal/GU Renal diseaseStage 3a CKD  negative genitourinary   Musculoskeletal negative musculoskeletal ROS (+)    Abdominal   Peds negative pediatric ROS (+)  Hematology negative hematology ROS (+)   Anesthesia Other Findings   Reproductive/Obstetrics negative OB ROS                              Anesthesia Physical Anesthesia Plan  ASA: 2  Anesthesia Plan: General   Post-op Pain Management:    Induction: Rapid sequence, Cricoid pressure planned and Intravenous  PONV Risk Score and Plan:   Airway Management Planned:   Additional Equipment:   Intra-op Plan:   Post-operative Plan: Extubation in OR  Informed Consent: I have reviewed the patients History and Physical, chart, labs and discussed the procedure including the risks, benefits and alternatives for the proposed anesthesia with the patient or authorized representative who has indicated his/her understanding and acceptance.     Dental advisory given  Plan Discussed with: CRNA  Anesthesia Plan Comments:         Anesthesia Quick Evaluation

## 2024-04-27 NOTE — Progress Notes (Signed)
 [facility]  Updated her husband. Noted acute appendicitis and no rupture. Possible small serosal injury of the stomach, oversewed with fat covering it.   Will send in Rx and will see how patient feels after surgery. Will determine if wants to go home or stay overnight.   Manuelita Pander, MD [facility] [address] [place], [place] [address] [phone] (office)

## 2024-04-27 NOTE — Discharge Instructions (Addendum)
 Discharge Robotic Assisted Laparoscopic Surgery Instructions:  Common Complaints: Right shoulder pain is common after laparoscopic surgery.  This is secondary to the gas used in the surgery being trapped under the diaphragm.  Walk to help your body absorb the gas. This will improve in a few days. Pain at the port sites are common, especially the larger port sites. This will improve with time.  Some nausea is common and poor appetite. The main goal is to stay hydrated the first few days after surgery.   Diet/ Activity: Diet as tolerated. You may not have an appetite, but it is important to stay hydrated.  Eating small meals is best.  Drink 64 ounces of water a day. Your appetite will return with time.  Shower per your regular routine daily.  Do not take hot showers. Take warm showers that are less than 10 minutes. Hot water can cause the glue to peel prematurely.  Rest and listen to your body, but do not remain in bed all day.  Walk everyday for at least 15-20 minutes. Deep cough and move around every 1-2 hours in the first few days after surgery.  Do not lift > 10 lbs, perform excessive bending, pushing, pulling, squatting for 1 week after surgery.  Do not pick at the dermabond glue on your incision sites.  This glue film will remain in place for 1-2 weeks and will start to peel off.  Do not place lotions or balms on your incision unless instructed to specifically by Dr. Kallie.   Acute Appendicitis: You did not have ruptured appendicitis, which means it is less likely to have issues like an abscess and you do not need antibiotics after surgery. It is still important to monitor for any worsening abdominal pain, fevers, nausea/vomiting after surgery. This could mean you have a developed an abscess. If you experience this let the office know, if it is a Holiday or Overnight please call and speak to the provider on call [phone], [phone]).   Pain Expectations and Narcotics: -After  surgery you will have pain associated with your incisions and this is normal. The pain is muscular and nerve pain, and will get better with time. -You are encouraged and expected to take non narcotic medications like tylenol and ibuprofen (when able) to treat pain as multiple modalities can aid with pain treatment. -Narcotics are only used when pain is severe or there is breakthrough pain. -You are not expected to have a pain score of 0 after surgery, as we cannot prevent pain. A pain score of 3-4 that allows you to be functional, move, walk, and tolerate some activity is the goal. The pain will continue to improve over the days after surgery and is dependent on your surgery. -Due to [place] law, we are only able to give a certain amount of pain medication to treat post operative pain, and we only give additional narcotics on a patient by patient basis.  -For most laparoscopic surgery, studies have shown that the majority of patients only need 10-15 narcotic pills, and for open surgeries most patients only need 15-20.   -Having appropriate expectations of pain and knowledge of pain management with non narcotics is important as we do not want anyone to become addicted to narcotic pain medication.  -Using ice packs in the first 48 hours and heating pads after 48 hours, wearing an abdominal binder (when recommended), and using over the counter medications are all ways to help with pain management.   -Simple acts like meditation  and mindfulness practices after surgery can also help with pain control and research has proven the benefit of these practices.  Medication: Take tylenol and ibuprofen as needed for pain control, alternating every 4-6 hours.  Example:  Tylenol 1000mg  @ 6am, 12noon, 6pm, (Do not exceed 4000mg  of tylenol a day). Ibuprofen 800mg  @ 9am, 3pm, 9pm, 3am (Do not exceed 3600mg  of ibuprofen a day).  Take Roxicodone for breakthrough pain every 4 hours.  Take Colace for constipation  related to narcotic pain medication. If you do not have a bowel movement in 2 days, take Miralax over the counter.  Drink plenty of water to also prevent constipation.   Contact Information: If you have questions or concerns, please call our office, [phone], Monday- Thursday 8AM-5PM and Friday 8AM-12Noon.  If it is after hours or on the weekend, please call [facility], [phone], [phone], and ask to speak to the surgeon on call for Dr. Kallie at [facility].

## 2024-04-28 ENCOUNTER — Encounter (HOSPITAL_COMMUNITY): Payer: Self-pay | Admitting: General Surgery

## 2024-05-01 ENCOUNTER — Telehealth: Payer: Self-pay | Admitting: *Deleted

## 2024-05-01 LAB — SURGICAL PATHOLOGY

## 2024-05-01 NOTE — Telephone Encounter (Signed)
 Surgical Date: 04/27/2024 Procedure: XI Robotic Assisted Laparoscopic Appendectomy   Received call from patient [phone]~ telephone.   Patient reports loose stools since returning home from surgery. Advised that this is normal, and should resolve within a few days.   Reports that she is not using pain medication as it is not needed. States that she is managing pain with APAp.  Advised to use blander high fiber diet. Advised to avoid high fat foods.   Advised if symptoms worsen or fail to improve to contact office for further recommendations.

## 2024-05-17 ENCOUNTER — Ambulatory Visit (INDEPENDENT_AMBULATORY_CARE_PROVIDER_SITE_OTHER): Admitting: General Surgery

## 2024-05-17 DIAGNOSIS — K353 Acute appendicitis with localized peritonitis, without perforation or gangrene: Secondary | ICD-10-CM

## 2024-05-17 NOTE — Progress Notes (Signed)
 [facility]  I am calling the patient for post operative evaluation. This is not a billable encounter as it is under the global charges for the surgery.  The patient had a robotic appendectomy on 04/27/24. The patient reports that she is doing good. The are tolerating a diet, having good pain control, and having Bms but has been loose at times.  The incisions are healing. The patient has no concerns.   Pathology: FINAL MICROSCOPIC DIAGNOSIS:   A. APPENDIX:  Acute appendicitis with acute serositis.   Will see the patient PRN.  Activity as tolerated.   Manuelita Pander, MD [facility] [address] [place], [place] [address] [phone] (office)

## 2024-05-23 ENCOUNTER — Telehealth: Admitting: Family Medicine

## 2024-05-23 ENCOUNTER — Encounter: Payer: Self-pay | Admitting: Family Medicine

## 2024-05-23 DIAGNOSIS — R197 Diarrhea, unspecified: Secondary | ICD-10-CM

## 2024-05-23 NOTE — Progress Notes (Signed)
 MyChart Video visit  Subjective: Joanne Sullivan PCP: Jolinda Norene HERO, DO YEP:Ajmajmj Joanne Sullivan is a 73 y.o. female. Patient provides verbal consent for consult held via video.  Due to COVID-19 pandemic this visit was conducted virtually. This visit type was conducted due to national recommendations for restrictions regarding the COVID-19 Pandemic (e.g. social distancing, sheltering in place) in an effort to limit this patient's exposure and mitigate transmission in our community. All issues noted in this document were discussed and addressed.  A physical exam was not performed with this format.   Location of patient: home Location of provider: [facility] Others present for call: none  1. Diarrhea Started on Sunday pm. She had chills then.  She reports persistent symptoms. No blood in stool.  She recently had her appendix out but this is definitely looser stools than postop.  No fevers, nausea, vomiting or body aches. ? Low grade fever because she had the sweats last night.  Has not eaten anything undercooked, spoiled.  No other sick contacts.  Tolerating fluids without difficulty.  Denies dizziness/ faint. She reports she is having diarrheal stools 6-8 per day.  No abdominal pain/ cramping.   ROS: Per HPI  No Known Allergies Past Medical History:  Diagnosis Date   Allergy    Chronic kidney disease    Glaucoma suspect    Heart murmur    PONV (postoperative nausea and vomiting)    Seasonal allergic reaction     Current Outpatient Medications:    Biotin 10 MG TABS, Take by mouth., Disp: , Rfl:    ondansetron  (ZOFRAN ) 4 MG tablet, Take 1 tablet (4 mg total) by mouth every 8 (eight) hours as needed., Disp: 30 tablet, Rfl: 1   oxyCODONE  (ROXICODONE ) 5 MG immediate release tablet, Take 1 tablet (5 mg total) by mouth every 4 (four) hours as needed for severe pain (pain score 7-10) or breakthrough pain., Disp: 8 tablet, Rfl: 0   risedronate  (ACTONEL ) 150 MG tablet, Take 1 tablet (150 mg total)  by mouth every 30 (thirty) days. with water  on empty stomach, nothing by mouth or lie down for next 30 minutes., Disp: 3 tablet, Rfl: 4  General: Well-appearing, nontoxic female HEENT: Sclera white.  Moist mucous membranes  Assessment/ Plan: 73 y.o. female   Diarrhea, unspecified type - Plan: Cdiff NAA+O+P+Stool Culture  Suspect viral gastroenteritis but I did go ahead and put in some stool studies for her, specifically to look for C. difficile given recent hospitalization.  Her symptomology does not seem consistent with that but I do expect her illness to improve within the next 48 hours and if it does not I would like her to come in for these labs.  She is also going at home COVID test herself given recent emergence of that virus in our community and if needed we will place her on renally dosed Paxlovid.  Push fluids.  Red flag signs and symptoms were reviewed.  She will follow-up as needed  Start time: 1:20pm End time: 1:28p,  Total time spent on patient care (including video visit/ documentation): 10 minutes  Joanne Sullivan Jolinda, DO [facility] [phone]

## 2024-05-29 ENCOUNTER — Ambulatory Visit: Payer: Self-pay | Admitting: *Deleted

## 2024-05-29 NOTE — Telephone Encounter (Signed)
 Message from Hansell R sent at 05/29/2024  7:44 AM EDT  Summary: diarrhea   Patient states she has had diarrhea for over 1 week. Had a video conference last week but is still having symptom.          Call History  Contact Date/Time Type Contact Phone/Fax By  05/29/2024 07:37 AM EDT Phone (Incoming) Sullivan, Joanne Kliethermes (Self) [phone] Therisa Willma CROME   Reason for Disposition  [1] MODERATE diarrhea (e.g., 4-6 times / day more than normal) AND [2] present > 48 hours (2 days)  Answer Assessment - Initial Assessment Questions 1. DIARRHEA SEVERITY: How bad is the diarrhea? How many more stools have you had in the past 24 hours than normal?      July 3rd had appendix removed.    2. ONSET: When did the diarrhea begin?      I'm having diarrhea since then.   6-8 times a day.   Last Tues. I did a video call.  I had 2 good days after that video visit.   So I didn't do the stool culture/test.   3. STOOL DESCRIPTION:  How loose or watery is the diarrhea? What is the stool color? Is there any blood or mucous in the stool?     Early this morning it wasn't watery but a little blood was seen in it.   Hemorrhoids  4. VOMITING: Are you also vomiting? If Yes, ask: How many times in the past 24 hours?      No 5. ABDOMEN PAIN: Are you having any abdomen pain? If Yes, ask: What does it feel like? (e.g., crampy, dull, intermittent, constant)      Last night it wasn't cramping but it didn't feel right.   Like nothing in my body. 6. ABDOMEN PAIN SEVERITY: If present, ask: How bad is the pain?  (e.g., Scale 1-10; mild, moderate, or severe)     No pain just uncomfortable. 7. ORAL INTAKE: If vomiting, Have you been able to drink liquids? How much liquids have you had in the past 24 hours?     I'm drinking a lot water . 8. HYDRATION: Any signs of dehydration? (e.g., dry mouth [not just dry lips], too weak to stand, dizziness, new weight loss) When did you last urinate?      No    I'm drinking Pedialyte.    9. EXPOSURE: Have you traveled to a foreign country recently? Have you been exposed to anyone with diarrhea? Could you have eaten any food that was spoiled?     N/A 10. ANTIBIOTIC USE: Are you taking antibiotics now or have you taken antibiotics in the past 2 months?       Yes after my appendectomy  11. OTHER SYMPTOMS: Do you have any other symptoms? (e.g., fever, blood in stool)       No 12. PREGNANCY: Is there any chance you are pregnant? When was your last menstrual period?       N/A  Protocols used: Diarrhea-A-AH Pt has kidney issues.   Can't take Pepto Bismol.   She is wanting to know if it's ok to take Imodium for the diarrhea.   Has an appt 05/30/2024 with DOD.   Pt agreeable to someone calling her back.     FYI Only or Action Required?: Action required by provider: clinical question for provider.  Patient was last seen in primary care on 05/23/2024 by Jolinda Norene HERO, DO.  Called Nurse Triage reporting Diarrhea. Having 6-8 stools a day of  watery diarrhea.   This morning she saw a little blood however she said her hemorrhoids were inflamed from th diarrhea.  Video visit done 05/23/2024 and told she may need to do stool samples if diarrhea continued. Suspected C. Diff.  Symptoms began several weeks ago. Had appendectomy in July.  Still having diarrhea  Interventions attempted: Rest, hydration, or home remedies. Drinking water  and Pedialyte  Symptoms are: unchanged.  Triage Disposition: See Physician Within 24 Hours  Patient/caregiver understands and will follow disposition?: Yes

## 2024-05-30 ENCOUNTER — Ambulatory Visit: Admitting: Family Medicine

## 2024-05-30 ENCOUNTER — Encounter: Payer: Self-pay | Admitting: Family Medicine

## 2024-05-30 VITALS — BP 89/62 | HR 83 | Temp 96.9°F | Ht 61.5 in | Wt 117.6 lb

## 2024-05-30 DIAGNOSIS — K591 Functional diarrhea: Secondary | ICD-10-CM | POA: Diagnosis not present

## 2024-05-30 MED ORDER — DIPHENOXYLATE-ATROPINE 2.5-0.025 MG PO TABS
ORAL_TABLET | ORAL | 2 refills | Status: DC
Start: 2024-05-30 — End: 2024-06-20

## 2024-05-30 NOTE — Progress Notes (Signed)
 Subjective:  Patient ID: Joanne Sullivan, female    DOB: 10-01-1951  Age: 73 y.o. MRN: [id number]  CC: Diarrhea   HPI Joanne Sullivan presents for Started with loose Bms. Now 5-6 watery Bms  daily. Follows any time she eats. No tenesmus. A little gnawing pain intermittently at LLQ. May have passed a little bit of blood.  Onset was immediately after having an appendectomy done on July 3.  This symptoms have waxed and waned somewhat but gotten worse over the last few days.  Over the last few days she has noticed more frequent watery bowel movements as opposed to occasional loose BMs.  She notes that there is very little if any pain.  She does point to the left upper quadrant area as a point of mild discomfort.     05/30/2024   10:38 AM 06/18/2023   12:58 PM 06/03/2023    1:05 PM  Depression screen PHQ 2/9  Joanne Sullivan 0 0 0  Down, Depressed, Hopeless 0 0 0  PHQ - 2 Score 0 0 0  Altered sleeping  0   Tired, Joanne energy  0   Change in appetite  0   Feeling bad or failure about yourself   0   Trouble concentrating  0   Moving slowly or fidgety/restless  0   Suicidal thoughts  0   PHQ-9 Score  0   Difficult doing work/chores  Not difficult at all     History Joanne Sullivan has a past medical history of Allergy, Chronic kidney disease, Glaucoma suspect, Heart murmur, PONV (postoperative nausea and vomiting), and Seasonal allergic reaction.   She has a past surgical history that includes left radial head fracture (Left) and Xi robotic laparoscopic assisted appendectomy (N/A, 04/27/2024).   Her family history includes Asthma in her mother; COPD in her father; Cancer in her sister; Hearing loss in her mother; Heart disease in her mother; Heart disease (age of onset: 69) in her father; Hypertension in her mother; Stroke in her mother.She reports that she has never smoked. She has never used smokeless tobacco. She reports that she does not drink alcohol and does not use  drugs.    ROS Review of Systems  Constitutional:  Negative for fever.  HENT:  Negative for congestion, rhinorrhea and sore throat.   Respiratory:  Negative for cough and shortness of breath.   Cardiovascular:  Negative for chest pain and palpitations.  Gastrointestinal:  Positive for diarrhea. Negative for abdominal pain, anal bleeding, blood in stool, constipation, nausea, rectal pain and vomiting.  Musculoskeletal:  Negative for arthralgias and myalgias.    Objective:  BP (!) 89/62   Pulse 83   Temp (!) 96.9 F (36.1 C)   Ht 5' 1.5 (1.562 m)   Wt 117 lb 9.6 oz (53.3 kg)   SpO2 99%   BMI 21.86 kg/m   BP Readings from Last 3 Encounters:  05/30/24 (!) 89/62  04/27/24 130/76  12/13/23 138/88    Wt Readings from Last 3 Encounters:  05/30/24 117 lb 9.6 oz (53.3 kg)  04/27/24 118 lb (53.5 kg)  12/13/23 120 lb (54.4 kg)     Physical Exam Constitutional:      Appearance: She is well-developed.  HENT:     Head: Normocephalic and atraumatic.  Cardiovascular:     Rate and Rhythm: Normal rate and regular rhythm.     Heart sounds: No murmur heard. Pulmonary:     Effort: Pulmonary effort is normal.     Breath  sounds: Normal breath sounds.  Abdominal:     General: Bowel sounds are normal. There is no distension.     Palpations: Abdomen is soft. There is no mass.     Tenderness: There is no abdominal tenderness. There is no guarding or rebound.  Skin:    General: Skin is warm and dry.  Neurological:     Mental Status: She is alert and oriented to person, place, and time.  Psychiatric:        Behavior: Behavior normal.      Assessment & Plan:  Functional diarrhea -     Cdiff NAA+O+P+Stool Culture  Other orders -     Diphenoxylate -Atropine ; Take one tablet 2-4 times a day until diarrhea resolves  Dispense: 40 tablet; Refill: 2   This appears to be functional from her recent surgery due to manipulation of the bowel.  However we will try to rule out infections  with the stool panel noted above.  And she will use the Lomotil  to try to bring the bowels back into the normal range.  I suggest also that she use Metamucil 1 tablespoon twice daily every day Follow-up: Return if symptoms worsen or fail to improve.  Butler Der, M.D.

## 2024-05-30 NOTE — Telephone Encounter (Signed)
 Called patient to relay Dr. Melba message, patient states she seen Dr. Zollie today and he sent her in some medication and she picked up the stool kit.

## 2024-05-30 NOTE — Telephone Encounter (Signed)
 Please direct her to come and get the stool kit as ordered last week.  This would be the next step in her care

## 2024-05-31 ENCOUNTER — Other Ambulatory Visit

## 2024-05-31 DIAGNOSIS — R197 Diarrhea, unspecified: Secondary | ICD-10-CM | POA: Diagnosis not present

## 2024-06-04 ENCOUNTER — Emergency Department (HOSPITAL_COMMUNITY)
Admission: EM | Admit: 2024-06-04 | Discharge: 2024-06-04 | Disposition: A | Discharge status: Home / Self Care | Source: Ambulatory Visit | Attending: Emergency Medicine | Admitting: Emergency Medicine

## 2024-06-04 ENCOUNTER — Encounter (HOSPITAL_COMMUNITY): Payer: Self-pay

## 2024-06-04 ENCOUNTER — Other Ambulatory Visit: Payer: Self-pay

## 2024-06-04 DIAGNOSIS — R197 Diarrhea, unspecified: Secondary | ICD-10-CM | POA: Diagnosis not present

## 2024-06-04 DIAGNOSIS — A0472 Enterocolitis due to Clostridium difficile, not specified as recurrent: Secondary | ICD-10-CM | POA: Diagnosis not present

## 2024-06-04 DIAGNOSIS — E876 Hypokalemia: Secondary | ICD-10-CM | POA: Diagnosis not present

## 2024-06-04 LAB — MAGNESIUM: Magnesium: 2 mg/dL (ref 1.7–2.4)

## 2024-06-04 LAB — CBC WITH DIFFERENTIAL/PLATELET
Abs Immature Granulocytes: 0.03 K/uL (ref 0.00–0.07)
Basophils Absolute: 0.1 K/uL (ref 0.0–0.1)
Basophils Relative: 1 %
Eosinophils Absolute: 0.2 K/uL (ref 0.0–0.5)
Eosinophils Relative: 2 %
HCT: 39.6 % (ref 36.0–46.0)
Hemoglobin: 13.1 g/dL (ref 12.0–15.0)
Immature Granulocytes: 0 %
Lymphocytes Relative: 11 %
Lymphs Abs: 1.2 K/uL (ref 0.7–4.0)
MCH: 30.3 pg (ref 26.0–34.0)
MCHC: 33.1 g/dL (ref 30.0–36.0)
MCV: 91.7 fL (ref 80.0–100.0)
Monocytes Absolute: 1.1 K/uL — ABNORMAL HIGH (ref 0.1–1.0)
Monocytes Relative: 10 %
Neutro Abs: 7.9 K/uL — ABNORMAL HIGH (ref 1.7–7.7)
Neutrophils Relative %: 76 %
Platelets: 248 K/uL (ref 150–400)
RBC: 4.32 MIL/uL (ref 3.87–5.11)
RDW: 12.1 % (ref 11.5–15.5)
WBC: 10.5 K/uL (ref 4.0–10.5)
nRBC: 0 % (ref 0.0–0.2)

## 2024-06-04 LAB — BASIC METABOLIC PANEL WITH GFR
Anion gap: 11 (ref 5–15)
BUN: 10 mg/dL (ref 8–23)
CO2: 23 mmol/L (ref 22–32)
Calcium: 8.7 mg/dL — ABNORMAL LOW (ref 8.9–10.3)
Chloride: 99 mmol/L (ref 98–111)
Creatinine, Ser: 0.8 mg/dL (ref 0.44–1.00)
GFR, Estimated: >60 mL/min (ref >60)
Glucose, Bld: 93 mg/dL (ref 70–99)
Potassium: 3.8 mmol/L (ref 3.5–5.1)
Sodium: 133 mmol/L — ABNORMAL LOW (ref 135–145)

## 2024-06-04 LAB — CDIFF NAA+O+P+STOOL CULTURE
E coli, Shiga toxin Assay: NEGATIVE
Toxigenic C. Difficile by PCR: POSITIVE — AB

## 2024-06-04 MED ORDER — VANCOMYCIN HCL 125 MG PO CAPS: 125.0000 mg | ORAL_CAPSULE | Freq: Four times a day (QID) | ORAL | 0 refills | Status: DC

## 2024-06-04 MED ORDER — VANCOMYCIN HCL 125 MG PO CAPS
125.0000 mg | ORAL_CAPSULE | Freq: Once | ORAL | Status: AC
  Administered 2024-06-04: 125 mg via ORAL
  Filled 2024-06-04 (×2): qty 1

## 2024-06-04 NOTE — ED Notes (Signed)
 Pt went to restroom but was unable to obtain a stool sample

## 2024-06-04 NOTE — ED Provider Notes (Signed)
 [place] [facility] AT [facility] Provider Note   CSN: [id number] Arrival date & time: 06/04/24  8068     Patient presents with: abnormal lab results   Joanne Sullivan is a 73 y.o. female.  Is been having loose stools since she had an appendectomy at the beginning of July.  It recently worsened last week.  PCP placed her on Lomotil  with improvement.  Today she found that she was positive for C. difficile.  She has not taken any of Lomotil  since Thursday and the diarrhea returned today.  There was a little bit of blood in the stool.  No abdominal pain or fever.  She said she also was getting a pain in the back of her head that she thinks was related to a antidiarrheal medicine but it is improved now.  No fevers numbness weakness.  {Add pertinent medical, surgical, social history, OB history to YEP:67052} The history is provided by the patient.  Diarrhea Quality:  Watery Severity:  Moderate Onset quality:  Sudden Duration:  1 day Timing:  Intermittent Progression:  Unchanged Associated symptoms: headaches   Associated symptoms: no abdominal pain, no fever and no vomiting   Risk factors: recent antibiotic use        Prior to Admission medications   Medication Sig Start Date End Date Taking? Authorizing Provider  Biotin 10 MG TABS Take by mouth.    [provider]  diphenoxylate -atropine  (LOMOTIL ) 2.5-0.025 MG tablet Take one tablet 2-4 times a day until diarrhea resolves 05/30/24   Zollie Lowers, MD  risedronate  (ACTONEL ) 150 MG tablet Take 1 tablet (150 mg total) by mouth every 30 (thirty) days. with water  on empty stomach, nothing by mouth or lie down for next 30 minutes. 06/19/23   Jolinda Norene HERO, DO    Allergies: Patient has no known allergies.    Review of Systems  Constitutional:  Negative for fever.  Gastrointestinal:  Positive for diarrhea. Negative for abdominal pain and vomiting.  Neurological:  Positive for headaches.    Updated  Vital Signs BP 120/74 (BP Location: Right Arm)   Pulse 76   Temp 98.6 F (37 C) (Oral)   Resp 18   Ht 5' 2 (1.575 m)   Wt 53.3 kg   SpO2 99%   BMI 21.51 kg/m   Physical Exam Vitals and nursing note reviewed.  Constitutional:      General: She is not in acute distress.    Appearance: Normal appearance. She is well-developed.  HENT:     Head: Normocephalic and atraumatic.  Eyes:     Conjunctiva/sclera: Conjunctivae normal.  Cardiovascular:     Rate and Rhythm: Normal rate and regular rhythm.     Heart sounds: No murmur heard. Pulmonary:     Effort: Pulmonary effort is normal. No respiratory distress.     Breath sounds: Normal breath sounds. No stridor. No wheezing.  Abdominal:     Palpations: Abdomen is soft.     Tenderness: There is no abdominal tenderness. There is no guarding or rebound.  Musculoskeletal:        General: No deformity.     Cervical back: Neck supple.  Skin:    General: Skin is warm and dry.  Neurological:     General: No focal deficit present.     Mental Status: She is alert.     GCS: GCS eye subscore is 4. GCS verbal subscore is 5. GCS motor subscore is 6.     (all labs ordered  are listed, but only abnormal results are displayed) Labs Reviewed  C DIFFICILE QUICK SCREEN W PCR REFLEX    BASIC METABOLIC PANEL WITH GFR  CBC WITH DIFFERENTIAL/PLATELET  MAGNESIUM    EKG: None  Radiology: No results found.  {Document cardiac monitor, telemetry assessment procedure when appropriate:32947} Procedures   Medications Ordered in the ED  vancomycin  (VANCOCIN ) capsule 125 mg (has no administration in time range)      {Click here for ABCD2, HEART and other calculators REFRESH Note before signing:1}                              Medical Decision Making Amount and/or Complexity of Data Reviewed Labs: ordered.  Risk Prescription drug management.   This patient complains of ***; this involves an extensive number of treatment Options and is a  complaint that carries with it a high risk of complications and morbidity. The differential includes ***  I ordered, reviewed and interpreted labs, which included *** I ordered medication *** and reviewed PMP when indicated. I ordered imaging studies which included *** and I independently    visualized and interpreted imaging which showed *** Additional history obtained from *** Previous records obtained and reviewed *** I consulted *** and discussed lab and imaging findings and discussed disposition.  Cardiac monitoring reviewed, *** Social determinants considered, *** Critical Interventions: ***  After the interventions stated above, I reevaluated the patient and found *** Admission and further testing considered, ***   {Document critical care time when appropriate  Document review of labs and clinical decision tools ie CHADS2VASC2, etc  Document your independent review of radiology images and any outside records  Document your discussion with family members, caretakers and with consultants  Document social determinants of health affecting pt's care  Document your decision making why or why not admission, treatments were needed:32947:::1}   Final diagnoses:  None    ED Discharge Orders     None

## 2024-06-04 NOTE — ED Notes (Signed)
 Waiting on meds from pharmacy

## 2024-06-04 NOTE — ED Triage Notes (Signed)
 Pt to ED with c/o diarrhea on and off for 3 weeks ago, pt saw PCP and was prescribed meds that resolved diarrhea, pt quit taking meds and diarrhea came back today, pt also received positive results for C-diff from [facility].

## 2024-06-04 NOTE — Discharge Instructions (Signed)
 You were seen in the emergency department for possible C. difficile infection.  Your lab work was unremarkable.  We are starting you on an antibiotic called vancomycin , 4 times a day.  Please keep well-hydrated.  Follow-up with your regular doctor and return to the emergency department if any worsening or concerning symptoms

## 2024-06-05 ENCOUNTER — Ambulatory Visit: Payer: Medicare PPO

## 2024-06-05 ENCOUNTER — Ambulatory Visit: Payer: Self-pay | Admitting: Family Medicine

## 2024-06-05 VITALS — BP 89/62 | HR 83 | Ht 62.0 in | Wt 117.0 lb

## 2024-06-05 DIAGNOSIS — Z0000 Encounter for general adult medical examination without abnormal findings: Secondary | ICD-10-CM

## 2024-06-05 MED ORDER — METRONIDAZOLE 500 MG PO TABS: 500.0000 mg | ORAL_TABLET | Freq: Three times a day (TID) | ORAL | 0 refills | Status: DC

## 2024-06-05 NOTE — Telephone Encounter (Signed)
 She is being treated for C. difficile with vancomycin .  Honestly I would probably not use the fiber right now.  Okay to use Lomotil  if absolutely needed but she really does not want to do much to inhibit the diarrhea because she needs to eliminate the infection from her bowels.  If she cannot stay hydrated then of course use the Lomotil .  Please make sure that she gets a probiotic for her gut to help stabilize her good bacteria.  Culturelle, align, etc. are all good brands

## 2024-06-05 NOTE — Telephone Encounter (Unsigned)
 Copied from CRM [id number]. Topic: Clinical - Medical Advice >> Jun 05, 2024 10:05 AM Farrel B wrote: Reason for CRM: [id number] (M) patient's states that she just had her appendix removed in July and has been have some issues with diarrhea, she was placed on lomotil  for the diarrhea which stops, diarrhea started back up visited the ER and has been placed on the antibiotic Vancocin , but also advised to take Metamucil daily. Patient states she is needing some advised on what to do, also needing to know if there's something she should know about a culture that was taken and if what ever was was found is causing the diarrhea.  diphenoxylate -atropine  (LOMOTIL ) 2.5-0.025 MG tablet vancomycin  (VANCOCIN ) 125 MG capsule Metamucil daily

## 2024-06-05 NOTE — Telephone Encounter (Signed)
 Patient aware. She says Lomotil  'flattens her out'. So she prefers not to take. She is going to try the probiotic.

## 2024-06-05 NOTE — Patient Instructions (Addendum)
 Joanne Sullivan , Thank you for taking time out of your busy schedule to complete your Annual Wellness Visit with me. I enjoyed our conversation and look forward to speaking with you again next year. I, as well as your care team,  appreciate your ongoing commitment to your health goals. Please review the following plan we discussed and let me know if I can assist you in the future. Your Game plan/ To Do List    Referrals: If you haven't heard from the office you've been referred to, please reach out to them at the phone provided.   Follow up Visits: We will see or speak with you next year for your Next Medicare AWV with our clinical staff on 06/06/25 at 10:40a.m. Have you seen your provider in the last 6 months (3 months if uncontrolled diabetes)? Yes  Clinician Recommendations:  Aim for 30 minutes of exercise or brisk walking, 6-8 glasses of water , and 5 servings of fruits and vegetables each day.       This is a list of the screenings recommended for you:  Health Maintenance  Topic Date Due   COVID-19 Vaccine (3 - Moderna risk series) 01/17/2020   Medicare Annual Wellness Visit  06/02/2024   Flu Shot  05/26/2024   DEXA scan (bone density measurement)  06/17/2024   Mammogram  08/18/2025   Colon Cancer Screening  03/26/2027   DTaP/Tdap/Td vaccine (3 - Td or Tdap) 05/31/2031   Pneumococcal Vaccine for age over 33  Completed   Hepatitis C Screening  Completed   Zoster (Shingles) Vaccine  Completed   Hepatitis B Vaccine  Aged Out   HPV Vaccine  Aged Out   Meningitis B Vaccine  Aged Out    Advanced directives: (Declined) Advance directive discussed with you today. Even though you declined this today, please call our office should you change your mind, and we can give you the proper paperwork for you to fill out. Advance Care Planning is important because it:  [x]  Makes sure you receive the medical care that is consistent with your values, goals, and preferences  [x]  It provides  guidance to your family and loved ones and reduces their decisional burden about whether or not they are making the right decisions based on your wishes.  Follow the link provided in your after visit summary or read over the paperwork we have mailed to you to help you started getting your Advance Directives in place. If you need assistance in completing these, please reach out to us  so that we can help you!  See attachments for Preventive Care and Fall Prevention Tips.

## 2024-06-05 NOTE — Progress Notes (Signed)
 Subjective:   Joanne Sullivan is a 73 y.o. who presents for a Medicare Wellness preventive visit.  As a reminder, Annual Wellness Visits don't include a physical exam, and some assessments may be limited, especially if this visit is performed virtually. We may recommend an in-person follow-up visit with your provider if needed.  Visit Complete: Virtual I connected with  [employer] on 06/05/24 by a audio enabled telemedicine application and verified that I am speaking with the correct person using two identifiers.  Patient Location: Home  Provider Location: Home Office  I discussed the limitations of evaluation and management by telemedicine. The patient expressed understanding and agreed to proceed.  Vital Signs: Because this visit was a virtual/telehealth visit, some criteria may be missing or patient reported. Any vitals not documented were not able to be obtained and vitals that have been documented are patient reported.  VideoDeclined- This patient declined Librarian, academic. Therefore the visit was completed with audio only.  Persons Participating in Visit: Patient.  AWV Questionnaire: No: Patient Medicare AWV questionnaire was not completed prior to this visit.  Cardiac Risk Factors include: advanced age (>46men, >44 women)     Objective:    Today's Vitals   06/05/24 1058  BP: (!) 89/62  Pulse: 83  Weight: 117 lb (53.1 kg)  Height: 5' 2 (1.575 m)   Body mass index is 21.4 kg/m.     06/05/2024   10:51 AM 06/04/2024    7:54 PM 04/27/2024   11:16 AM 04/27/2024    5:49 AM 06/03/2023    1:06 PM 06/01/2022    3:46 PM 05/29/2021    4:27 PM  Advanced Directives  Does Patient Have a Medical Advance Directive? No No No Yes Yes Yes No  Type of Advance Directive    Living will Healthcare Power of [place];Living will Healthcare Power of [place];Living will   Copy of Healthcare Power of Attorney in Chart?     No - copy requested No - copy  requested   Would patient like information on creating a medical advance directive?  No - Patient declined No - Patient declined    Yes (MAU/Ambulatory/Procedural Areas - Information given)    Current Medications (verified) Outpatient Encounter Medications as of 06/05/2024  Medication Sig   Biotin 10 MG TABS Take by mouth.   diphenoxylate -atropine  (LOMOTIL ) 2.5-0.025 MG tablet Take one tablet 2-4 times a day until diarrhea resolves   risedronate  (ACTONEL ) 150 MG tablet Take 1 tablet (150 mg total) by mouth every 30 (thirty) days. with water  on empty stomach, nothing by mouth or lie down for next 30 minutes.   vancomycin  (VANCOCIN ) 125 MG capsule Take 1 capsule (125 mg total) by mouth 4 (four) times daily.   No facility-administered encounter medications on file as of 06/05/2024.    Allergies (verified) Patient has no known allergies.   History: Past Medical History:  Diagnosis Date   Allergy    Chronic kidney disease    Glaucoma suspect    Heart murmur    PONV (postoperative nausea and vomiting)    Seasonal allergic reaction    Past Surgical History:  Procedure Laterality Date   left radial head fracture Left    XI ROBOTIC LAPAROSCOPIC ASSISTED APPENDECTOMY N/A 04/27/2024   Procedure: APPENDECTOMY, ROBOT-ASSISTED, LAPAROSCOPIC;  Surgeon: Kallie Manuelita BROCKS, MD;  Location: [facility];  Service: General;  Laterality: N/A;   Family History  Problem Relation Age of Onset   Heart disease Mother  2 MIs   Hypertension Mother    Asthma Mother    Hearing loss Mother    Stroke Mother    Heart disease Father 71       AMI   COPD Father    Cancer Sister        ovarian   Social History   Socioeconomic History   Marital status: Married    Spouse name: [place]   Number of children: 1   Years of education: Not on file   Highest education level: Associate degree: academic program  Occupational History   Occupation: retired  Tobacco Use   Smoking status: Never   Smokeless tobacco:  Never  Vaping Use   Vaping status: Never Used  Substance and Sexual Activity   Alcohol use: No   Drug use: No   Sexual activity: Not on file  Other Topics Concern   Not on file  Social History Narrative   Lives home with husband. Son lives in [place].   Very active - goes to the gym, plays pickle ball, goes line dancing   Active member at church and ladies group   Social Drivers of Health   Financial Resource Strain: Low Risk  (06/05/2024)   Overall Financial Resource Strain (CARDIA)    Difficulty of Paying Living Expenses: Not hard at all  Food Insecurity: No Food Insecurity (06/05/2024)   Hunger Vital Sign    Worried About Running Out of Food in the Last Year: Never true    Ran Out of Food in the Last Year: Never true  Transportation Needs: No Transportation Needs (06/05/2024)   PRAPARE - Administrator, Civil Service (Medical): No    Lack of Transportation (Non-Medical): No  Physical Activity: Sufficiently Active (06/05/2024)   Exercise Vital Sign    Days of Exercise per Week: 5 days    Minutes of Exercise per Session: 150+ min  Stress: No Stress Concern Present (06/05/2024)   [name] of Occupational Health - Occupational Stress Questionnaire    Feeling of Stress: Not at all  Social Connections: Socially Integrated (06/05/2024)   Social Connection and Isolation Panel    Frequency of Communication with Friends and Family: More than three times a week    Frequency of Social Gatherings with Friends and Family: More than three times a week    Attends Religious Services: More than 4 times per year    Active Member of [employer] or Organizations: Yes    Attends Engineer, structural: More than 4 times per year    Marital Status: Married    Tobacco Counseling Counseling given: Yes    Clinical Intake:  Pre-visit preparation completed: Yes  Pain : No/denies pain     Nutritional Risks: None Diabetes: No  No results found for: HGBA1C    How often do you need to have someone help you when you read instructions, pamphlets, or other written materials from your doctor or pharmacy?: 1 - Never  Interpreter Needed?: No  Information entered by :: alia t/cma   Activities of Daily Living     06/05/2024   10:49 AM 04/27/2024   11:23 AM  In your present state of health, do you have any difficulty performing the following activities:  Hearing? 0 0  Vision? 0 0  Difficulty concentrating or making decisions? 0 0  Walking or climbing stairs? 0   Dressing or bathing? 0   Doing errands, shopping? 0   Preparing Food and eating ? N  Using the Toilet? N   In the past six months, have you accidently leaked urine? Y   Do you have problems with loss of bowel control? Y   Comment diahrrea since Surgery   Managing your Medications? N   Managing your Finances? N   Housekeeping or managing your Housekeeping? N     Patient Care Team: Jolinda Norene HERO, DO as PCP - General (Family Medicine)  I have updated your Care Teams any recent Medical Services you may have received from other providers in the past year.     Assessment:   This is a routine wellness examination for [place].  Hearing/Vision screen Hearing Screening - Comments:: Pt denies hearing dif Vision Screening - Comments:: Pt wears contacts/pt goes [facility]/last ov 1/25   Goals Addressed   None    Depression Screen     06/05/2024   10:54 AM 05/30/2024   10:38 AM 06/18/2023   12:58 PM 06/03/2023    1:05 PM 06/15/2022    3:00 PM 06/01/2022    3:40 PM 11/27/2021    3:50 PM  PHQ 2/9 Scores  PHQ - 2 Score 0 0 0 0 0 0 0  PHQ- 9 Score 0  0        Fall Risk     06/05/2024   10:47 AM 05/30/2024   10:38 AM 06/18/2023   12:58 PM 06/03/2023    1:04 PM 06/02/2023    4:09 PM  Fall Risk   Falls in the past year? 0 0 0 0 0  Number falls in past yr: 0  0 0   Injury with Fall? 0  0 0   Risk for fall due to : No Fall Risks  No Fall Risks No Fall Risks   Follow up  Falls evaluation completed  Education provided Falls prevention discussed     MEDICARE RISK AT HOME:  Medicare Risk at Home Any stairs in or around the home?: Yes If so, are there any without handrails?: Yes Home free of loose throw rugs in walkways, pet beds, electrical cords, etc?: Yes Adequate lighting in your home to reduce risk of falls?: Yes Life alert?: No Use of a cane, walker or w/c?: No Grab bars in the bathroom?: Yes Shower chair or bench in shower?: Yes Elevated toilet seat or a handicapped toilet?: Yes  TIMED UP AND GO:  Was the test performed?  no  Cognitive Function: 6CIT completed        06/05/2024   10:55 AM 06/03/2023    1:07 PM 06/01/2022    3:41 PM 05/29/2021    4:26 PM  6CIT Screen  What Year? 0 points 0 points 0 points 0 points  What month? 0 points 0 points 0 points 0 points  What time? 0 points 0 points 0 points 0 points  Count back from 20 0 points 0 points 0 points 0 points  Months in reverse 0 points 0 points 0 points 0 points  Repeat phrase 0 points 0 points 0 points 0 points  Total Score 0 points 0 points 0 points 0 points    Immunizations Immunization History  Administered Date(s) Administered   Influenza,inj,Quad PF,6+ Mos 08/04/2013, 10/30/2016   Moderna Sars-Covid-2 Vaccination 11/24/2019, 12/20/2019   Pneumococcal Conjugate-13 04/11/2019   Pneumococcal Polysaccharide-23 05/15/2020   Td 05/30/2021   Tdap 07/24/2011   Zoster Recombinant(Shingrix ) 06/15/2022, 10/06/2022    Screening Tests Health Maintenance  Topic Date Due   COVID-19 Vaccine (3 - Moderna  risk series) 01/17/2020   INFLUENZA VACCINE  05/26/2024   DEXA SCAN  06/17/2024   Medicare Annual Wellness (AWV)  06/05/2025   MAMMOGRAM  08/18/2025   Colonoscopy  03/26/2027   DTaP/Tdap/Td (3 - Td or Tdap) 05/31/2031   Pneumococcal Vaccine: 50+ Years  Completed   Hepatitis C Screening  Completed   Zoster Vaccines- Shingrix   Completed   Hepatitis B Vaccines  Aged Out   HPV  VACCINES  Aged Out   Meningococcal B Vaccine  Aged Out    Health Maintenance  Health Maintenance Due  Topic Date Due   COVID-19 Vaccine (3 - Moderna risk series) 01/17/2020   INFLUENZA VACCINE  05/26/2024   Health Maintenance Items Addressed: See Nurse Notes at the end of this note  Additional Screening:  Vision Screening: Recommended annual ophthalmology exams for early detection of glaucoma and other disorders of the eye. Would you like a referral to an eye doctor? No    Dental Screening: Recommended annual dental exams for proper oral hygiene  Community Resource Referral / Chronic Care Management: CRR required this visit?  No   CCM required this visit?  No   Plan:    I have personally reviewed and noted the following in the patient's chart:   Medical and social history Use of alcohol, tobacco or illicit drugs  Current medications and supplements including opioid prescriptions. Patient is not currently taking opioid prescriptions. Functional ability and status Nutritional status Physical activity Advanced directives List of other physicians Hospitalizations, surgeries, and ER visits in previous 12 months Vitals Screenings to include cognitive, depression, and falls Referrals and appointments  In addition, I have reviewed and discussed with patient certain preventive protocols, quality metrics, and best practice recommendations. A written personalized care plan for preventive services as well as general preventive health recommendations were provided to patient.   Ozie Ned, CMA   06/05/2024   After Visit Summary: (MyChart) Due to this being a telephonic visit, the after visit summary with patients personalized plan was offered to patient via MyChart   Notes: Nothing significant to report at this time.

## 2024-06-06 NOTE — Telephone Encounter (Signed)
 Called patient, not available, left message to return call

## 2024-06-06 NOTE — Telephone Encounter (Signed)
 Spoke with patient, she reports she went to ER and they prescribed antibiotic as well. Do you want her to take the flagyl  in addition the antibiotic prescribed by ER. Return call at [phone]

## 2024-06-07 NOTE — Telephone Encounter (Signed)
 LEFT

## 2024-06-07 NOTE — Telephone Encounter (Signed)
 Patient aware and verbalized understanding.

## 2024-06-13 ENCOUNTER — Telehealth: Payer: Self-pay | Admitting: Family Medicine

## 2024-06-13 DIAGNOSIS — M810 Age-related osteoporosis without current pathological fracture: Secondary | ICD-10-CM

## 2024-06-13 DIAGNOSIS — Z13220 Encounter for screening for lipoid disorders: Secondary | ICD-10-CM

## 2024-06-13 DIAGNOSIS — N1831 Chronic kidney disease, stage 3a: Secondary | ICD-10-CM

## 2024-06-13 NOTE — Telephone Encounter (Signed)
 Already done

## 2024-06-13 NOTE — Telephone Encounter (Signed)
 Done. Please set her up for a fsting lab visit  Orders Placed This Encounter  Procedures   CMP14+EGFR   Lipid panel   VITAMIN D  25 Hydroxy (Vit-D Deficiency, Fractures)   CBC

## 2024-06-13 NOTE — Telephone Encounter (Signed)
 Patient has appt 8-26 with Dr. KANDICE for physical and wants to come and do her labs that morning before appt. Please add orders for labs.

## 2024-06-13 NOTE — Telephone Encounter (Signed)
 Copied from CRM [id number]. Topic: Appointments - Scheduling Inquiry for Clinic >> Jun 13, 2024 12:17 PM Selinda RAMAN wrote: Reason for CRM: The patient called asking if she can schedule a lab appointment for this Thursday as she has a physical next Tuesday with her provider. I spoke with Mitzi and she is leaving a message to put orders in for labs so the patient can be scheduled to come in on Thursday. She said someone will contact the patient as soon as the orders have been put in. I informed the patient of this as well.

## 2024-06-19 ENCOUNTER — Other Ambulatory Visit

## 2024-06-19 DIAGNOSIS — Z13220 Encounter for screening for lipoid disorders: Secondary | ICD-10-CM | POA: Diagnosis not present

## 2024-06-19 DIAGNOSIS — N1831 Chronic kidney disease, stage 3a: Secondary | ICD-10-CM

## 2024-06-19 DIAGNOSIS — M810 Age-related osteoporosis without current pathological fracture: Secondary | ICD-10-CM | POA: Diagnosis not present

## 2024-06-19 LAB — CBC

## 2024-06-19 LAB — LIPID PANEL

## 2024-06-20 ENCOUNTER — Ambulatory Visit: Payer: Self-pay | Admitting: Family Medicine

## 2024-06-20 ENCOUNTER — Encounter: Payer: Self-pay | Admitting: Family Medicine

## 2024-06-20 ENCOUNTER — Other Ambulatory Visit: Payer: Self-pay

## 2024-06-20 ENCOUNTER — Ambulatory Visit: Payer: Medicare PPO | Admitting: Family Medicine

## 2024-06-20 ENCOUNTER — Other Ambulatory Visit: Payer: Self-pay | Admitting: Family Medicine

## 2024-06-20 ENCOUNTER — Other Ambulatory Visit

## 2024-06-20 VITALS — BP 109/74 | HR 92 | Temp 97.2°F | Ht 62.0 in | Wt 114.5 lb

## 2024-06-20 DIAGNOSIS — E559 Vitamin D deficiency, unspecified: Secondary | ICD-10-CM

## 2024-06-20 DIAGNOSIS — N182 Chronic kidney disease, stage 2 (mild): Secondary | ICD-10-CM

## 2024-06-20 DIAGNOSIS — M810 Age-related osteoporosis without current pathological fracture: Secondary | ICD-10-CM

## 2024-06-20 DIAGNOSIS — A0472 Enterocolitis due to Clostridium difficile, not specified as recurrent: Secondary | ICD-10-CM

## 2024-06-20 DIAGNOSIS — D72829 Elevated white blood cell count, unspecified: Secondary | ICD-10-CM

## 2024-06-20 DIAGNOSIS — Z0001 Encounter for general adult medical examination with abnormal findings: Secondary | ICD-10-CM | POA: Diagnosis not present

## 2024-06-20 DIAGNOSIS — Z0000 Encounter for general adult medical examination without abnormal findings: Secondary | ICD-10-CM

## 2024-06-20 LAB — CMP14+EGFR
ALT: 18 IU/L (ref 0–32)
AST: 22 IU/L (ref 0–40)
Albumin: 4.1 g/dL (ref 3.8–4.8)
Alkaline Phosphatase: 102 IU/L (ref 44–121)
BUN/Creatinine Ratio: 14 (ref 12–28)
BUN: 13 mg/dL (ref 8–27)
Bilirubin Total: 1 mg/dL (ref 0.0–1.2)
CO2: 21 mmol/L (ref 20–29)
Calcium: 9 mg/dL (ref 8.7–10.3)
Chloride: 101 mmol/L (ref 96–106)
Creatinine, Ser: 0.9 mg/dL (ref 0.57–1.00)
Globulin, Total: 2.8 g/dL (ref 1.5–4.5)
Glucose: 88 mg/dL (ref 70–99)
Potassium: 4.1 mmol/L (ref 3.5–5.2)
Sodium: 138 mmol/L (ref 134–144)
Total Protein: 6.9 g/dL (ref 6.0–8.5)
eGFR: 68 mL/min/1.73 (ref >59)

## 2024-06-20 LAB — CBC
Hematocrit: 42.9 (ref 34.0–46.6)
Hemoglobin: 14.1 g/dL (ref 11.1–15.9)
MCH: 30 pg (ref 26.6–33.0)
MCHC: 32.9 g/dL (ref 31.5–35.7)
MCV: 91 fL (ref 79–97)
Platelets: 261 x10E3/uL (ref 150–450)
RBC: 4.7 x10E6/uL (ref 3.77–5.28)
RDW: 11.9 (ref 11.7–15.4)
WBC: 12.4 x10E3/uL — AB (ref 3.4–10.8)

## 2024-06-20 LAB — LIPID PANEL
Cholesterol, Total: 120 mg/dL (ref 100–199)
HDL: 80 mg/dL (ref >39)
LDL CALC COMMENT:: 1.5 ratio (ref 0.0–4.4)
LDL Chol Calc (NIH): 26 mg/dL (ref 0–99)
Triglycerides: 64 mg/dL (ref 0–149)
VLDL Cholesterol Cal: 14 mg/dL (ref 5–40)

## 2024-06-20 LAB — VITAMIN D 25 HYDROXY (VIT D DEFICIENCY, FRACTURES): Vit D, 25-Hydroxy: 47.9 ng/mL (ref 30.0–100.0)

## 2024-06-20 MED ORDER — RISEDRONATE SODIUM 150 MG PO TABS: 150.0000 mg | ORAL_TABLET | ORAL | 4 refills | Status: AC

## 2024-06-20 NOTE — Progress Notes (Signed)
 Joanne Sullivan is a 73 y.o. female presents to office today for annual physical exam examination.    Discussed the use of AI scribe software for clinical note transcription with the patient, who gave verbal consent to proceed.  History of Present Illness   Joanne Sullivan is a 73 year old female who presents with recurrent diarrhea after completing a course of antibiotics for CDiff.  She has experienced a recurrence of diarrhea with the same severity as before her recent antibiotic treatment, which she completed last Thursday. The diarrhea is frequent and urgent, occurring as early as 5:30 AM, although the smell is not quite the same as before.  No blood in stool.  No nausea, vomiting or difficulty hydrating.  She has been taking Culturelle. Despite dietary adjustments, including eating bananas, crackers, and chicken, she continues to experience diarrhea. Her activities are mostly in the morning, including exercise and classes, and she experiences multiple bowel movements during this time, which she manages around her schedule.  She experienced chills, fever, and sweating this past Sunday, similar to symptoms she had weeks ago. She initially thought she was coming down with something and performed a COVID test, which was negative. She has not had any more fever since then.  No chest pain, shortness of breath, swallowing difficulties, vaginal bleeding, or urinary incontinence. She has a dermatology appointment scheduled for February.  She maintains an active lifestyle, engaging in activities like tennis and pickleball, and reports feeling good overall aside from the diarrhea. She ensures adequate hydration, especially with the increased fluid loss from diarrhea, and has been monitoring her kidney function, which she reports as improved.      Occupation: retired, Substance use: none Health Maintenance Due  Topic Date Due   DEXA SCAN  06/17/2024   Refills needed today:  actonel   Immunization History  Administered Date(s) Administered   Influenza,inj,Quad PF,6+ Mos 08/04/2013, 10/30/2016   Moderna Sars-Covid-2 Vaccination 11/24/2019, 12/20/2019   Pneumococcal Conjugate-13 04/11/2019   Pneumococcal Polysaccharide-23 05/15/2020   Td 05/30/2021   Tdap 07/24/2011   Zoster Recombinant(Shingrix ) 06/15/2022, 10/06/2022   Past Medical History:  Diagnosis Date   Allergy    Chronic kidney disease    Glaucoma suspect    Heart murmur    PONV (postoperative nausea and vomiting)    Seasonal allergic reaction    Social History   Socioeconomic History   Marital status: Married    Spouse name: [place]   Number of children: 1   Years of education: Not on file   Highest education level: Associate degree: academic program  Occupational History   Occupation: retired  Tobacco Use   Smoking status: Never   Smokeless tobacco: Never  Vaping Use   Vaping status: Never Used  Substance and Sexual Activity   Alcohol use: No   Drug use: No   Sexual activity: Not on file  Other Topics Concern   Not on file  Social History Narrative   Lives home with husband. Son lives in [place].   Very active - goes to the gym, plays pickle ball, goes line dancing   Active member at church and ladies group   Social Drivers of Health   Financial Resource Strain: Low Risk  (06/05/2024)   Overall Financial Resource Strain (CARDIA)    Difficulty of Paying Living Expenses: Not hard at all  Food Insecurity: No Food Insecurity (06/05/2024)   Hunger Vital Sign    Worried About Running Out of Food in the Last Year: Never  true    Ran Out of Food in the Last Year: Never true  Transportation Needs: No Transportation Needs (06/05/2024)   PRAPARE - Administrator, Civil Service (Medical): No    Lack of Transportation (Non-Medical): No  Physical Activity: Sufficiently Active (06/05/2024)   Exercise Vital Sign    Days of Exercise per Week: 5 days    Minutes of Exercise per  Session: 150+ min  Stress: No Stress Concern Present (06/05/2024)   [name] of Occupational Health - Occupational Stress Questionnaire    Feeling of Stress: Not at all  Social Connections: Socially Integrated (06/05/2024)   Social Connection and Isolation Panel    Frequency of Communication with Friends and Family: More than three times a week    Frequency of Social Gatherings with Friends and Family: More than three times a week    Attends Religious Services: More than 4 times per year    Active Member of [employer] or Organizations: Yes    Attends Banker Meetings: More than 4 times per year    Marital Status: Married  Catering manager Violence: Not At Risk (06/05/2024)   Humiliation, Afraid, Rape, and Kick questionnaire    Fear of Current or Ex-Partner: No    Emotionally Abused: No    Physically Abused: No    Sexually Abused: No   Past Surgical History:  Procedure Laterality Date   left radial head fracture Left    XI ROBOTIC LAPAROSCOPIC ASSISTED APPENDECTOMY N/A 04/27/2024   Procedure: APPENDECTOMY, ROBOT-ASSISTED, LAPAROSCOPIC;  Surgeon: Kallie Manuelita BROCKS, MD;  Location: [facility];  Service: General;  Laterality: N/A;   Family History  Problem Relation Age of Onset   Heart disease Mother        2 MIs   Hypertension Mother    Asthma Mother    Hearing loss Mother    Stroke Mother    Heart disease Father 21       AMI   COPD Father    Cancer Sister        ovarian    Current Outpatient Medications:    Biotin 10 MG TABS, Take by mouth., Disp: , Rfl:    Lactobacillus-Inulin (CULTURELLE ADULT ULT BALANCE PO), Take by mouth., Disp: , Rfl:    risedronate  (ACTONEL ) 150 MG tablet, Take 1 tablet (150 mg total) by mouth every 30 (thirty) days. with water  on empty stomach, nothing by mouth or lie down for next 30 minutes., Disp: 3 tablet, Rfl: 4  No Known Allergies   ROS: Review of Systems Pertinent items noted in HPI and remainder of comprehensive ROS otherwise  negative.    Physical exam BP 109/74   Pulse 92   Temp (!) 97.2 F (36.2 C)   Ht 5' 2 (1.575 m)   Wt 114 lb 8 oz (51.9 kg)   SpO2 99%   BMI 20.94 kg/m  General appearance: alert, cooperative, appears stated age, no distress, and fit female Head: Normocephalic, without obvious abnormality, atraumatic Eyes: negative findings: lids and lashes normal, conjunctivae and sclerae normal, corneas clear, and pupils equal, round, reactive to light and accomodation Ears: normal TM's and external ear canals both ears Nose: Nares normal. Septum deviated to right. Mucosa normal. No drainage or sinus tenderness. Throat: lips, mucosa, and tongue normal; teeth and gums normal Neck: no adenopathy, no carotid bruit, supple, symmetrical, trachea midline, and thyroid  not enlarged, symmetric, no tenderness/mass/nodules Back: symmetric, no curvature. ROM normal. No CVA tenderness. Lungs: clear to auscultation  bilaterally Heart: regular rate and rhythm, S1, S2 normal, no murmur, click, rub or gallop Abdomen: soft, non-tender; bowel sounds normal; no masses,  no organomegaly Extremities: varicose veins present in bilateral lower extremities. Pulses: 2+ and symmetric Skin: Skin color, texture, turgor normal. No rashes or lesions Lymph nodes: Cervical, supraclavicular, and axillary nodes normal. Neurologic: Grossly normal      06/05/2024   10:54 AM 05/30/2024   10:38 AM 06/18/2023   12:58 PM  Depression screen PHQ 2/9  Decreased Interest 0 0 0  Down, Depressed, Hopeless 0 0 0  PHQ - 2 Score 0 0 0  Altered sleeping 0  0  Tired, decreased energy 0  0  Change in appetite 0  0  Feeling bad or failure about yourself  0  0  Trouble concentrating 0  0  Moving slowly or fidgety/restless 0  0  Suicidal thoughts 0  0  PHQ-9 Score 0  0  Difficult doing work/chores Not difficult at all  Not difficult at all      06/18/2023   12:58 PM 06/15/2022    3:00 PM 11/27/2021    3:50 PM 05/30/2021    8:07 AM  GAD 7 :  Generalized Anxiety Score  Nervous, Anxious, on Edge 0 0 0 0  Control/stop worrying 0 0 0 0  Worry too much - different things 0 0 0 0  Trouble relaxing 0 0 0 0  Restless 0 0 0 0  Easily annoyed or irritable 0 0 0 0  Afraid - awful might happen 0 0 0 0  Total GAD 7 Score 0 0 0 0  Anxiety Difficulty Not difficult at all Not difficult at all Not difficult at all Not difficult at all   Results for orders placed or performed in visit on 06/19/24 (from the past 72 hours)  CBC     Status: Abnormal   Collection Time: 06/19/24  9:19 AM  Result Value Ref Range   WBC 12.4 (H) 3.4 - 10.8 x10E3/uL   RBC 4.70 3.77 - 5.28 x10E6/uL   Hemoglobin 14.1 11.1 - 15.9 g/dL   Hematocrit 57.0 65.9 - 46.6 %   MCV 91 79 - 97 fL   MCH 30.0 26.6 - 33.0 pg   MCHC 32.9 31.5 - 35.7 g/dL   RDW 88.0 88.2 - 84.5 %   Platelets 261 150 - 450 x10E3/uL  VITAMIN D  25 Hydroxy (Vit-D Deficiency, Fractures)     Status: None   Collection Time: 06/19/24  9:19 AM  Result Value Ref Range   Vit D, 25-Hydroxy 47.9 30.0 - 100.0 ng/mL    Comment: Vitamin D  deficiency has been defined by the Institute of Medicine and an Endocrine Society practice guideline as a level of serum 25-OH vitamin D  less than 20 ng/mL (1,2). The Endocrine Society went on to further define vitamin D  insufficiency as a level between 21 and 29 ng/mL (2). 1. IOM (Institute of Medicine). 2010. Dietary reference    intakes for calcium and D. [place]: The    [employer]. 2. Holick MF, Binkley Zebulon, Bischoff-Ferrari HA, et al.    Evaluation, treatment, and prevention of vitamin D     deficiency: an Endocrine Society clinical practice    guideline. JCEM. 2011 Jul; 96(7):1911-30.   Lipid panel     Status: None   Collection Time: 06/19/24  9:19 AM  Result Value Ref Range   Cholesterol, Total 120 100 - 199 mg/dL   Triglycerides 64 0 - 149  mg/dL   HDL 80 >60 mg/dL   VLDL Cholesterol Cal 14 5 - 40 mg/dL   LDL Chol Calc (NIH) 26 0 - 99 mg/dL    Chol/HDL Ratio 1.5 0.0 - 4.4 ratio    Comment:                                   T. Chol/HDL Ratio                                             Men  Women                               1/2 Avg.Risk  3.4    3.3                                   Avg.Risk  5.0    4.4                                2X Avg.Risk  9.6    7.1                                3X Avg.Risk 23.4   11.0   CMP14+EGFR     Status: None   Collection Time: 06/19/24  9:19 AM  Result Value Ref Range   Glucose 88 70 - 99 mg/dL   BUN 13 8 - 27 mg/dL   Creatinine, Ser 9.09 0.57 - 1.00 mg/dL   eGFR 68 >40 fO/fpw/8.26   BUN/Creatinine Ratio 14 12 - 28   Sodium 138 134 - 144 mmol/L   Potassium 4.1 3.5 - 5.2 mmol/L   Chloride 101 96 - 106 mmol/L   CO2 21 20 - 29 mmol/L   Calcium 9.0 8.7 - 10.3 mg/dL   Total Protein 6.9 6.0 - 8.5 g/dL   Albumin 4.1 3.8 - 4.8 g/dL   Globulin, Total 2.8 1.5 - 4.5 g/dL   Bilirubin Total 1.0 0.0 - 1.2 mg/dL   Alkaline Phosphatase 102 44 - 121 IU/L   AST 22 0 - 40 IU/L   ALT 18 0 - 32 IU/L   Assessment/ Plan: Joanne Sullivan here for annual physical exam.   Annual physical exam  Leukocytosis, unspecified type  Age-related osteoporosis without current pathological fracture - Plan: DG [facility] DEXA, risedronate  (ACTONEL ) 150 MG tablet  Vitamin D  deficiency - Plan: DG [facility] DEXA  Chronic kidney disease, stage 2 (mild) - Plan: DG [facility] DEXA  C. difficile diarrhea  Assessment and Plan Labs reviewed.    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 GI referral +/- fecal transplant as last resort. Culturelle ongoing. - Order stool study to confirm C. difficile infection. - Consider fidaxomicin  (Dificid ) if stool study is positive. - Continue Culturelle. - Advise on potential need for pharmacy to order fidaxomicin  due to limited availability. - Discuss fecal transplant as a  last resort option if fidaxomicin  is ineffective.  Leukocytosis Previous WBC count slightly elevated. No acute infection  symptoms. Discussed potential causes including abscesses or gut issues. Option to repeat CBC in next week - Repeat CBC to monitor white blood cell count. - Advise on timing for CBC repeat.  Fever and chills, resolved Recent fever and chills resolved spontaneously. Discussed potential link to systemic response or C. difficile infection. Considered COVID-19 due to GI symptoms and night fevers. - Consider PCR COVID-19 test if symptoms recur or if she desires testing.     Osteoporosis associated with CKD2 - BMP with improving renal function. GFR >60 - DEXA ordered and will be completed today. - Continue Ca/Vit d supplementation/ weight bearing exercise.  Counseled on healthy lifestyle choices, including diet (rich in fruits, vegetables and lean meats and low in salt and simple carbohydrates) and exercise (at least 30 minutes of moderate physical activity daily).  Patient to follow up 1 year for CPE  Jozef Eisenbeis M. Jolinda, DO

## 2024-06-20 NOTE — Patient Instructions (Signed)
C. Diff Infection C. diffinfection, or C. diff, is an infection that is caused by C. diff germs. These germs, called bacteria, cause watery poop (diarrhea) and very bad inflammation in the colon. This infection often happens after taking antibiotics. C. diffcan spread easily to others. What are the causes? Taking certain antibiotics. Coming in contact with people, food, or things that have the germ on it. What increases the risk? Taking certain antibiotics for a long time. Staying in a hospital or nursing home for a long time. Being age 17 or older. Having had C. diff infection before or been exposed to C. diff. Having a weak disease-fighting, or immune, system. Having serious health problems, including: Colon cancer. Inflammatory bowel disease (IBD). Taking medicines that treat stomach acid. Having had surgery on your digestive system. What are the signs or symptoms? Watery poop. Fever. Feeling like you may vomit. Not feeling hungry. Swelling, pain, cramps, or a tender belly. How is this treated? Treatment may include: Stopping the antibiotics that caused the C. diff infection. Taking antibiotics that kill C. diff. Placing poop from a healthy person into your colon. This is called a fecal transplant. Having surgery to take out the infected part of the colon. This is rare. Follow these instructions at home: Medicines Take over-the-counter and prescription medicines only as told by your doctor. Take your antibiotics as told by your doctor. Do not stop taking them even if you start to feel better. Do not take medicines that treat watery poop unless your doctor tells you to. Eating and drinking Follow instructions from your  doctor about what you may eat and drink. Eat bland foods in small amounts, such as: Bananas. Applesauce. Rice. Toast. Avoid milk, caffeine, and alcohol. To prevent loss of fluid in your body, or dehydration: Drink clear fluids to keep your pee (urine) pale yellow. This includes water, ice chips, clear fruit juice with water added to it, or low-calorie sports drinks. Have a drink called an oral rehydration solution (ORS). You can buy this drink at pharmacies and retail stores. General instructions Wash your hands often with soap and water. Do this for at least 20 seconds. Take a bath or shower every day. Return to your normal activities when your doctor says that it's safe. Be sure your home is clean before you leave the hospital or clinic. Clean every day for at least a week. Keep all follow-up visits to make sure your infection is gone. How is this prevented? Wash [employer] your hands often with soap and water for at least 20 seconds. Wash your hands before you cook and after you use the bathroom. Other people should wash their hands too, especially: People who live with you. People who visit you  in a hospital or clinic. Stop germs from spreading Tell your doctor if you get watery poop while you are in the hospital or nursing home. When you visit someone in the hospital or nursing home, wear a gown, gloves, or other protection. Try to: Stay away from people who have watery poop. Use a different bathroom if you're sick and live with other people. Clean surfaces Clean surfaces that you touch every day. Use a product that has a 10% chlorine bleach solution. Be sure to: Read the product label to make sure that the product will kill the germs on your surfaces. Clean toilets and flush handles, bathtubs, sinks, doorknobs and handles, countertops, and work surfaces. If you're in the hospital, make sure the surfaces in your room are cleaned each day. Tell someone right away if body fluids  have splashed or spilled. Wash clothes and linens Wash clothes and linens using laundry soap that has chlorine bleach. Be sure to: Use powder soap instead of liquid. Clean your washing machine once a month. To do this, turn on the hot setting with only soap in it. Contact a doctor if: Your symptoms do not get better or get worse. Your symptoms go away and then come back. You have a fever. You have new symptoms. Get help right away if: Your belly is more tender or painful. Your poop is bloody. Your poop looks black. You vomit every time you eat or drink. You have signs of not having enough fluids in your body. These include: Dark yellow pee, very little pee, or no pee. Cracked lips or dry mouth. No tears when you cry. Sunken eyes. Feeling tired. Feeling weak or dizzy. This information is not intended to replace advice given to you by your health care provider. Make sure you discuss any questions you have with your health care provider. Document Revised: 01/04/2023 Document Reviewed: 01/04/2023 Elsevier Patient Education  2024 [employer].

## 2024-06-21 ENCOUNTER — Ambulatory Visit: Payer: Self-pay | Admitting: Family Medicine

## 2024-06-21 DIAGNOSIS — A0472 Enterocolitis due to Clostridium difficile, not specified as recurrent: Secondary | ICD-10-CM

## 2024-06-21 LAB — CLOSTRIDIUM DIFFICILE BY PCR: Toxigenic C. Difficile by PCR: POSITIVE — AB

## 2024-06-21 MED ORDER — FIDAXOMICIN 200 MG PO TABS: 200.0000 mg | ORAL_TABLET | ORAL | 0 refills | Status: AC

## 2024-06-21 MED ORDER — FIDAXOMICIN 200 MG PO TABS: 200.0000 mg | ORAL_TABLET | Freq: Two times a day (BID) | ORAL | 0 refills | Status: AC

## 2024-06-23 ENCOUNTER — Ambulatory Visit (INDEPENDENT_AMBULATORY_CARE_PROVIDER_SITE_OTHER)

## 2024-06-23 DIAGNOSIS — N182 Chronic kidney disease, stage 2 (mild): Secondary | ICD-10-CM

## 2024-06-23 DIAGNOSIS — M810 Age-related osteoporosis without current pathological fracture: Secondary | ICD-10-CM

## 2024-06-23 DIAGNOSIS — E559 Vitamin D deficiency, unspecified: Secondary | ICD-10-CM

## 2024-06-29 ENCOUNTER — Other Ambulatory Visit

## 2024-06-29 DIAGNOSIS — D72829 Elevated white blood cell count, unspecified: Secondary | ICD-10-CM | POA: Diagnosis not present

## 2024-06-29 DIAGNOSIS — A0472 Enterocolitis due to Clostridium difficile, not specified as recurrent: Secondary | ICD-10-CM

## 2024-06-29 DIAGNOSIS — M8589 Other specified disorders of bone density and structure, multiple sites: Secondary | ICD-10-CM | POA: Diagnosis not present

## 2024-06-29 LAB — CBC WITH DIFFERENTIAL/PLATELET
Basophils Absolute: 0.1 x10E3/uL (ref 0.0–0.2)
Basos: 2 %
EOS (ABSOLUTE): 0.4 x10E3/uL (ref 0.0–0.4)
Eos: 7 %
Hematocrit: 39.2 % (ref 34.0–46.6)
Hemoglobin: 12.7 g/dL (ref 11.1–15.9)
Immature Grans (Abs): 0 x10E3/uL (ref 0.0–0.1)
Immature Granulocytes: 0 %
Lymphocytes Absolute: 1.4 x10E3/uL (ref 0.7–3.1)
Lymphs: 24 %
MCH: 29.2 pg (ref 26.6–33.0)
MCHC: 32.4 g/dL (ref 31.5–35.7)
MCV: 90 fL (ref 79–97)
Monocytes Absolute: 0.6 x10E3/uL (ref 0.1–0.9)
Monocytes: 10 %
Neutrophils Absolute: 3.3 x10E3/uL (ref 1.4–7.0)
Neutrophils: 57 %
Platelets: 274 x10E3/uL (ref 150–450)
RBC: 4.35 x10E6/uL (ref 3.77–5.28)
RDW: 12 % (ref 11.7–15.4)
WBC: 5.7 x10E3/uL (ref 3.4–10.8)

## 2024-07-03 ENCOUNTER — Encounter: Payer: Self-pay | Admitting: Family Medicine

## 2024-07-03 ENCOUNTER — Ambulatory Visit: Payer: Self-pay | Admitting: Family Medicine

## 2024-08-21 DIAGNOSIS — R92323 Mammographic fibroglandular density, bilateral breasts: Secondary | ICD-10-CM | POA: Diagnosis not present

## 2024-08-21 DIAGNOSIS — Z1231 Encounter for screening mammogram for malignant neoplasm of breast: Secondary | ICD-10-CM | POA: Diagnosis not present

## 2024-08-21 LAB — HM MAMMOGRAPHY

## 2025-06-06 ENCOUNTER — Ambulatory Visit: Payer: Self-pay
